# Patient Record
Sex: Female | Born: 1955 | Race: White | Hispanic: No | Marital: Single | Smoking: Never smoker
Health system: Southern US, Community
[De-identification: ages and names within clinical notes are randomized; demographics above are authoritative.]

## PROBLEM LIST (undated history)

## (undated) DIAGNOSIS — G43909 Migraine, unspecified, not intractable, without status migrainosus: Secondary | ICD-10-CM

## (undated) HISTORY — PX: SHOULDER ARTHROSCOPY: SHX128

## (undated) HISTORY — PX: BELOW KNEE LEG AMPUTATION: SUR23

## (undated) HISTORY — DX: Migraine, unspecified, not intractable, without status migrainosus: G43.909

---

## 1999-10-01 ENCOUNTER — Other Ambulatory Visit: Admission: RE | Admit: 1999-10-01 | Discharge: 1999-10-01 | Payer: Self-pay | Admitting: Obstetrics and Gynecology

## 2001-01-09 ENCOUNTER — Other Ambulatory Visit: Admission: RE | Admit: 2001-01-09 | Discharge: 2001-01-09 | Payer: Self-pay | Admitting: Obstetrics and Gynecology

## 2002-01-10 ENCOUNTER — Other Ambulatory Visit: Admission: RE | Admit: 2002-01-10 | Discharge: 2002-01-10 | Payer: Self-pay | Admitting: Obstetrics and Gynecology

## 2010-08-12 HISTORY — PX: OTHER SURGICAL HISTORY: SHX169

## 2012-02-23 ENCOUNTER — Ambulatory Visit: Payer: Self-pay | Admitting: Family Medicine

## 2012-03-01 ENCOUNTER — Encounter: Payer: Self-pay | Admitting: Family Medicine

## 2012-03-01 ENCOUNTER — Ambulatory Visit (INDEPENDENT_AMBULATORY_CARE_PROVIDER_SITE_OTHER): Payer: BC Managed Care – PPO | Admitting: Family Medicine

## 2012-03-01 VITALS — BP 120/76 | HR 65 | Temp 98.0°F | Ht 68.0 in | Wt 167.5 lb

## 2012-03-01 DIAGNOSIS — Z89512 Acquired absence of left leg below knee: Secondary | ICD-10-CM

## 2012-03-01 DIAGNOSIS — S88119A Complete traumatic amputation at level between knee and ankle, unspecified lower leg, initial encounter: Secondary | ICD-10-CM

## 2012-03-01 DIAGNOSIS — M1710 Unilateral primary osteoarthritis, unspecified knee: Secondary | ICD-10-CM

## 2012-03-01 DIAGNOSIS — M1711 Unilateral primary osteoarthritis, right knee: Secondary | ICD-10-CM | POA: Insufficient documentation

## 2012-03-01 DIAGNOSIS — G47419 Narcolepsy without cataplexy: Secondary | ICD-10-CM

## 2012-03-01 DIAGNOSIS — IMO0002 Reserved for concepts with insufficient information to code with codable children: Secondary | ICD-10-CM

## 2012-03-01 DIAGNOSIS — K130 Diseases of lips: Secondary | ICD-10-CM | POA: Insufficient documentation

## 2012-03-01 DIAGNOSIS — J309 Allergic rhinitis, unspecified: Secondary | ICD-10-CM

## 2012-03-01 DIAGNOSIS — Z13220 Encounter for screening for lipoid disorders: Secondary | ICD-10-CM

## 2012-03-01 NOTE — Patient Instructions (Addendum)
Schedule complete physical exam with fasting labs prior. Apply topical OTC hydrocortisone cream 2 % to lip lesion twice daily for 2 weeks. Call for derm referral if area not resolving... To set up biopsy.

## 2012-03-01 NOTE — Progress Notes (Signed)
  Subjective:    Patient ID: Valerie Mcguire, female    DOB: 06/20/1956, 56 y.o.   MRN: [id number]  HPI  56 year old female present to establish. Goes to Dr. Henderson Cloud for GYN. Last CPX few years ago.  Has been a while since last lab testing.  Area in lower lip for 2-3 month, dry and flaky. Nontender.  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  Has some pain in left knee stump from these after walking over rubble. Celebrex from friend helped significantly.  Ortho ([facility]) in past gave her a muscle relaxer and it did not help much.  Ibuprofen has not helped in past.  Review of Systems      Objective:   Physical Exam  Constitutional: Vital signs are normal. She appears well-developed and well-nourished. She is cooperative.  Non-toxic appearance. She does not appear ill. No distress.  HENT:  Head: Normocephalic.  Right Ear: Hearing, tympanic membrane, external ear and ear canal normal.  Left Ear: Hearing, tympanic membrane, external ear and ear canal normal.  Nose: Nose normal.  Eyes: Conjunctivae, EOM and lids are normal. Pupils are equal, round, and reactive to light. No foreign bodies found.  Neck: Trachea normal and normal range of motion. Neck supple. Carotid bruit is not present. No mass and no thyromegaly present.  Cardiovascular: Normal rate, regular rhythm, S1 normal, S2 normal, normal heart sounds and intact distal pulses.  Exam reveals no gallop.   No murmur heard. Pulmonary/Chest: Effort normal and breath sounds normal. No respiratory distress. She has no wheezes. She has no rhonchi. She has no rales.  Abdominal: Soft. Normal appearance and bowel sounds are normal. She exhibits no distension, no fluid wave, no abdominal bruit and no mass. There is no hepatosplenomegaly. There is no tenderness. There is no rebound, no guarding and no CVA tenderness. No hernia.  Musculoskeletal:       Left  BKA.  Lymphadenopathy:    She has no cervical adenopathy.    She has no  axillary adenopathy.  Neurological: She is alert. She has normal strength. No cranial nerve deficit or sensory deficit.  Skin: Skin is warm, dry and intact. Rash noted.       3 mm round flaky skin lesion at lower right lip vermillion border  Psychiatric: Her speech is normal and behavior is normal. Judgment normal. Her mood appears not anxious. Cognition and memory are normal. She does not exhibit a depressed mood.          Assessment & Plan:

## 2012-03-01 NOTE — Assessment & Plan Note (Signed)
Persistent lesion.. May be dermatitis, no suggestion of blister/herpes. If not improving refer to derm for biopsy.

## 2012-03-02 ENCOUNTER — Other Ambulatory Visit: Payer: Self-pay | Admitting: *Deleted

## 2012-03-02 MED ORDER — CELECOXIB 200 MG PO CAPS: 200.0000 mg | ORAL_CAPSULE | Freq: Every day | ORAL | Status: DC

## 2012-03-06 ENCOUNTER — Telehealth: Payer: Self-pay

## 2012-03-06 NOTE — Telephone Encounter (Signed)
Pt said med for lips not called to pharmacy. On 03/01/12 Dr Ermalene Searing recommended OTC Hydrocortisone cream 2% to apply topically to lip; if not better call back for dermatology referral. Pt voiced understanding.

## 2012-04-12 ENCOUNTER — Ambulatory Visit (INDEPENDENT_AMBULATORY_CARE_PROVIDER_SITE_OTHER): Payer: BC Managed Care – PPO | Admitting: Family Medicine

## 2012-04-12 ENCOUNTER — Encounter: Payer: Self-pay | Admitting: Family Medicine

## 2012-04-12 ENCOUNTER — Other Ambulatory Visit (HOSPITAL_COMMUNITY)
Admission: RE | Admit: 2012-04-12 | Discharge: 2012-04-12 | Disposition: A | Discharge status: Home / Self Care | Payer: BC Managed Care – PPO | Source: Ambulatory Visit | Attending: Family Medicine | Admitting: Family Medicine

## 2012-04-12 VITALS — BP 100/70 | HR 68 | Temp 98.2°F | Ht 68.0 in | Wt 169.5 lb

## 2012-04-12 DIAGNOSIS — D3613 Benign neoplasm of peripheral nerves and autonomic nervous system of lower limb, including hip: Secondary | ICD-10-CM | POA: Insufficient documentation

## 2012-04-12 DIAGNOSIS — Z0000 Encounter for general adult medical examination without abnormal findings: Secondary | ICD-10-CM

## 2012-04-12 DIAGNOSIS — Z01419 Encounter for gynecological examination (general) (routine) without abnormal findings: Secondary | ICD-10-CM | POA: Insufficient documentation

## 2012-04-12 DIAGNOSIS — Z1212 Encounter for screening for malignant neoplasm of rectum: Secondary | ICD-10-CM

## 2012-04-12 DIAGNOSIS — Z13220 Encounter for screening for lipoid disorders: Secondary | ICD-10-CM

## 2012-04-12 DIAGNOSIS — Z1231 Encounter for screening mammogram for malignant neoplasm of breast: Secondary | ICD-10-CM

## 2012-04-12 DIAGNOSIS — D2120 Benign neoplasm of connective and other soft tissue of unspecified lower limb, including hip: Secondary | ICD-10-CM

## 2012-04-12 DIAGNOSIS — K130 Diseases of lips: Secondary | ICD-10-CM

## 2012-04-12 MED ORDER — TRIAMCINOLONE ACETONIDE 0.5 % EX CREA: TOPICAL_CREAM | Freq: Two times a day (BID) | CUTANEOUS | Status: DC

## 2012-04-12 NOTE — Patient Instructions (Addendum)
Stop at front desk to schedule mammogram. If neuroma reoccurs we will try meloxicam for pain. Try topical triamcinolone on lip lesion. Call for derm referral if not improivng in 2 weeks. If vaginal bleeding recurs.. Call for further recommendations.

## 2012-04-12 NOTE — Progress Notes (Signed)
Subjective:    Patient ID: Valerie Mcguire, female    DOB: [birth year], 56 y.o.   MRN: [id number]  HPI The patient is here for annual wellness exam and preventative care.    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  Has some pain in left knee stump from these after walking over rubble. Celebrex from friend helped significantly.  Ortho ([facility]) in past gave her a muscle relaxer and it did not help much.  Will do labs today while at appt.  Doing well overall.  She did not some vaginal spotting last week and vaginal itching .. vagisil helped. No current vaginal itching or discharge. Last regular menses over 1 year ago.  Lip lesion: OTC hydrocortisone .Marland Kitchen Did not help. She wants to try something else before going to [facility].  Review of Systems  Constitutional: Negative for fever, fatigue and unexpected weight change.  HENT: Negative for ear pain, congestion, sore throat, sneezing, trouble swallowing and sinus pressure.   Eyes: Negative for pain and itching.  Respiratory: Negative for cough, shortness of breath and wheezing.   Cardiovascular: Negative for chest pain, palpitations and leg swelling.  Gastrointestinal: Negative for nausea, abdominal pain, diarrhea, constipation and blood in stool.  Genitourinary: Negative for dysuria, hematuria, vaginal discharge, difficulty urinating and menstrual problem.  Skin: Negative for rash.  Neurological: Negative for syncope, weakness, light-headedness, numbness and headaches.  Psychiatric/Behavioral: Negative for confusion and dysphoric mood. The patient is not nervous/anxious.        Objective:   Physical Exam  Constitutional: Vital signs are normal. She appears well-developed and well-nourished. She is cooperative.  Non-toxic appearance. She does not appear ill. No distress.  HENT:  Head: Normocephalic.  Right Ear: Hearing, tympanic membrane, external ear and ear canal normal.  Left Ear: Hearing, tympanic membrane, external ear  and ear canal normal.  Nose: Nose normal.  Eyes: Conjunctivae, EOM and lids are normal. Pupils are equal, round, and reactive to light. No foreign bodies found.  Neck: Trachea normal and normal range of motion. Neck supple. Carotid bruit is not present. No mass and no thyromegaly present.  Cardiovascular: Normal rate, regular rhythm, S1 normal, S2 normal, normal heart sounds and intact distal pulses.  Exam reveals no gallop.   No murmur heard. Pulmonary/Chest: Effort normal and breath sounds normal. No respiratory distress. She has no wheezes. She has no rhonchi. She has no rales.  Abdominal: Soft. Normal appearance and bowel sounds are normal. She exhibits no distension, no fluid wave, no abdominal bruit and no mass. There is no hepatosplenomegaly. There is no tenderness. There is no rebound, no guarding and no CVA tenderness. No hernia.  Genitourinary: Vagina normal and uterus normal. No breast swelling, tenderness, discharge or bleeding. Pelvic exam was performed with patient prone. There is no rash, tenderness or lesion on the right labia. There is no rash, tenderness or lesion on the left labia. Uterus is not enlarged and not tender. Cervix exhibits no motion tenderness, no discharge and no friability. Right adnexum displays no mass, no tenderness and no fullness. Left adnexum displays no mass, no tenderness and no fullness.  Musculoskeletal:       Left  BKA.  Lymphadenopathy:    She has no cervical adenopathy.    She has no axillary adenopathy.  Neurological: She is alert. She has normal strength. No cranial nerve deficit or sensory deficit.  Skin: Skin is warm, dry and intact. Rash noted.       3 mm round  flaky skin lesion at lower right lip vermillion border  Psychiatric: Her speech is normal and behavior is normal. Judgment normal. Her mood appears not anxious. Cognition and memory are normal. She does not exhibit a depressed mood.          Assessment & Plan:  The patient's  preventative maintenance and recommended screening tests for an annual wellness exam were reviewed in full today. Brought up to date unless services declined.  Counselled on the importance of diet, exercise, and its role in overall health and mortality. The patient's FH and SH was reviewed, including their home life, tobacco status, and drug and alcohol status.   Vaccines: Uptodate with Td PAP/DVE: in past has seen Dr. Henderson [facility], plans to do here now. If nml plan q 2 year for pap and yearly for DVE.  Colon: ifob.  Nonsmoker Mammo:  Will schedule.

## 2012-04-12 NOTE — Addendum Note (Signed)
Addended by: Josph Macho A on: 04/12/2012 04:14 PM   Modules accepted: Orders

## 2012-04-12 NOTE — Assessment & Plan Note (Signed)
Possible basal or squamous cell. Will try treating inflammatory issues with topical steroid, stronger than PTC... If not improving will refer to [facility] for biopsy.

## 2012-04-12 NOTE — Addendum Note (Signed)
Addended by: Alvina Chou on: 04/12/2012 03:55 PM   Modules accepted: Orders

## 2012-04-13 LAB — COMPREHENSIVE METABOLIC PANEL
Alkaline Phosphatase: 64 U/L (ref 39–117)
BUN: 18 mg/dL (ref 6–23)
CO2: 27 mEq/L (ref 19–32)
Creatinine, Ser: 1 mg/dL (ref 0.4–1.2)
GFR: 64.63 mL/min (ref >60.00)
Glucose, Bld: 84 mg/dL (ref 70–99)
Sodium: 139 mEq/L (ref 135–145)
Total Bilirubin: 0.9 mg/dL (ref 0.3–1.2)
Total Protein: 6.3 g/dL (ref 6.0–8.3)

## 2012-04-13 LAB — LIPID PANEL
Cholesterol: 165 mg/dL (ref 0–200)
HDL: 50.8 mg/dL (ref >39.00)
LDL Cholesterol: 105 mg/dL — ABNORMAL HIGH (ref 0–99)
Triglycerides: 45 mg/dL (ref 0.0–149.0)
VLDL: 9 mg/dL (ref 0.0–40.0)

## 2012-04-17 ENCOUNTER — Other Ambulatory Visit: Payer: BC Managed Care – PPO

## 2012-04-17 DIAGNOSIS — Z1212 Encounter for screening for malignant neoplasm of rectum: Secondary | ICD-10-CM

## 2012-04-18 ENCOUNTER — Encounter: Payer: Self-pay | Admitting: *Deleted

## 2012-04-23 ENCOUNTER — Encounter: Payer: Self-pay | Admitting: *Deleted

## 2012-05-02 ENCOUNTER — Ambulatory Visit: Payer: Self-pay | Admitting: Family Medicine

## 2012-05-03 ENCOUNTER — Encounter: Payer: Self-pay | Admitting: Family Medicine

## 2012-05-08 ENCOUNTER — Encounter: Payer: Self-pay | Admitting: Family Medicine

## 2012-05-08 ENCOUNTER — Encounter: Payer: Self-pay | Admitting: *Deleted

## 2012-07-12 ENCOUNTER — Ambulatory Visit (INDEPENDENT_AMBULATORY_CARE_PROVIDER_SITE_OTHER): Payer: BC Managed Care – PPO | Admitting: Family Medicine

## 2012-07-12 ENCOUNTER — Encounter: Payer: Self-pay | Admitting: Family Medicine

## 2012-07-12 ENCOUNTER — Telehealth: Payer: Self-pay | Admitting: Family Medicine

## 2012-07-12 VITALS — BP 118/78 | HR 64 | Temp 98.3°F | Wt 168.5 lb

## 2012-07-12 DIAGNOSIS — R51 Headache: Secondary | ICD-10-CM

## 2012-07-12 DIAGNOSIS — R519 Headache, unspecified: Secondary | ICD-10-CM | POA: Insufficient documentation

## 2012-07-12 MED ORDER — CYCLOBENZAPRINE HCL 10 MG PO TABS: 10.0000 mg | ORAL_TABLET | Freq: Two times a day (BID) | ORAL | Status: AC | PRN

## 2012-07-12 MED ORDER — KETOROLAC TROMETHAMINE 30 MG/ML IJ SOLN
30.0000 mg | Freq: Once | INTRAMUSCULAR | Status: AC
  Administered 2012-07-12: 30 mg via INTRAMUSCULAR

## 2012-07-12 NOTE — Telephone Encounter (Signed)
Caller: Jaclynne/Patient; Patient Name: Valerie Mcguire; PCP: Kerby Nora [facility]); Best Callback Phone Number: [phone]  Today, 07/12/2012 , pt calling  with complaints of Headache since 1200 noon.  Pain is ove right eye and is making the eye to water.  She has taken anthistamine , and OTC Migraine tablet  every 3-4 hours since then , but still with pain. Has had some relief since last night , but rating 6-7/10.  Has had light sensivity and has been laying in dark bedroom. RN reached See ED Immediately disposition for increased light sensitivity and not previously evaluated per Headache protocol. NO appts with Dr. Ermalene Searing ,nor Dr. Patsy Lager. RN noted a 4:00 appt  with Dr Ruta Hinds. RN called office and "Rose" advised may place pt on schedule at 4:00 with him and pt advised.

## 2012-07-12 NOTE — Telephone Encounter (Signed)
Saw today

## 2012-07-12 NOTE — Progress Notes (Signed)
  Subjective:    Patient ID: Valerie Mcguire, female    DOB: 03/16/1956, 56 y.o.   MRN: [id number]  HPI CC: HA  56 yo with 24 hour h/o R headache behind R eye, described as pounding.  Constant throbbing ache.  Minimal nausea.  + photo/phonophobia.  + watery eye.  + droopy eye.  No sharp stabbing pain that lasts seconds.  Has tried nonsedating antihistamine and OTC migraine medicine, haven't helped.  Denies vision changes.    Has had headaches like this in past, last one was in spring.  Tends to get a few a year.  Usually improves with sleep.  This one isn't improving.  No fevers/chills, neck pain, sinus congestion, ear pain, or tooth pain.  No shortness of breath.  No dizziness.  No past medical history on file.   Review of Systems Per HPI    Objective:   Physical Exam  Nursing note and vitals reviewed. Constitutional: She appears well-developed and well-nourished. No distress.  HENT:  Head: Normocephalic and atraumatic.  Right Ear: Hearing, tympanic membrane, external ear and ear canal normal.  Left Ear: Hearing, tympanic membrane, external ear and ear canal normal.  Nose: No mucosal edema or rhinorrhea. Right sinus exhibits no maxillary sinus tenderness and no frontal sinus tenderness. Left sinus exhibits no maxillary sinus tenderness and no frontal sinus tenderness.  Mouth/Throat: Uvula is midline, oropharynx is clear and moist and mucous membranes are normal. No oropharyngeal exudate, posterior oropharyngeal edema, posterior oropharyngeal erythema or tonsillar abscesses.  Eyes: EOM are normal. Pupils are equal, round, and reactive to light. Right conjunctiva is injected (mild conjunctival). Right conjunctiva has no hemorrhage. Left conjunctiva is not injected. Left conjunctiva has no hemorrhage. No scleral icterus. Right eye exhibits normal extraocular motion and no nystagmus. Left eye exhibits normal extraocular motion and no nystagmus.       Ptosis and lacrimation  present. PERRLA  Neck: Normal range of motion. Neck supple.  Cardiovascular: Normal rate, regular rhythm, normal heart sounds and intact distal pulses.   No murmur heard. Pulmonary/Chest: Effort normal and breath sounds normal. No respiratory distress. She has no wheezes. She has no rales.  Lymphadenopathy:    She has no cervical adenopathy.  Neurological: No cranial nerve deficit or sensory deficit.       No nystagmus.       Assessment & Plan:

## 2012-07-12 NOTE — Patient Instructions (Addendum)
Sounds like you are having a migraine, less consistent with cluster headaches. Treat with shot of toradol today. May fill flexeril muscle relaxant to help stop headache at home.  Get plenty of rest and stay well hydrated. Try to back off caffeine if you drink a significant amount. Call us with questions. I hope you start feeling better. If not improving or any worsening, please seek care again for further evaluation.  Migraine Headache A migraine headache is an intense, throbbing pain on one or both sides of your head. A migraine can last for 30 minutes to several hours. CAUSES  The exact cause of a migraine headache is not always known. However, a migraine may be caused when nerves in the brain become irritated and release chemicals that cause inflammation. This causes pain. SYMPTOMS  Pain on one or both sides of your head.  Pulsating or throbbing pain.  Severe pain that prevents daily activities.  Pain that is aggravated by any physical activity.  Nausea, vomiting, or both.  Dizziness.  Pain with exposure to bright lights, loud noises, or activity.  General sensitivity to bright lights, loud noises, or smells. Before you get a migraine, you may get warning signs that a migraine is coming (aura). An aura may include:  Seeing flashing lights.  Seeing bright spots, halos, or zig-zag lines.  Having tunnel vision or blurred vision.  Having feelings of numbness or tingling.  Having trouble talking.  Having muscle weakness. MIGRAINE TRIGGERS  Alcohol.  Smoking.  Stress.  Menstruation.  Aged cheeses.  Foods or drinks that contain nitrates, glutamate, aspartame, or tyramine.  Lack of sleep.  Chocolate.  Caffeine.  Hunger.  Physical exertion.  Fatigue.  Medicines used to treat chest pain (nitroglycerine), birth control pills, estrogen, and some blood pressure medicines. DIAGNOSIS  A migraine headache is often diagnosed based on:  Symptoms.  Physical  examination.  A CT scan or MRI of your head. TREATMENT Medicines may be given for pain and nausea. Medicines can also be given to help prevent recurrent migraines.  HOME CARE INSTRUCTIONS  Only take over-the-counter or prescription medicines for pain or discomfort as directed by your caregiver. The use of long-term narcotics is not recommended.  Lie down in a dark, quiet room when you have a migraine.  Keep a journal to find out what may trigger your migraine headaches. For example, write down:  What you eat and drink.  How much sleep you get.  Any change to your diet or medicines.  Limit alcohol consumption.  Quit smoking if you smoke.  Get 7 to 9 hours of sleep, or as recommended by your caregiver.  Limit stress.  Keep lights dim if bright lights bother you and make your migraines worse. SEEK IMMEDIATE MEDICAL CARE IF:   Your migraine becomes severe.  You have a fever.  You have a stiff neck.  You have vision loss.  You have muscular weakness or loss of muscle control.  You start losing your balance or have trouble walking.  You feel faint or pass out.  You have severe symptoms that are different from your first symptoms. MAKE SURE YOU:   Understand these instructions.  Will watch your condition.  Will get help right away if you are not doing well or get worse. Document Released: 08/29/2005 Document Revised: 11/21/2011 Document Reviewed: 08/19/2011 [facility] Patient Information 2013 [place], [place].

## 2012-07-12 NOTE — Assessment & Plan Note (Addendum)
R periorbital headache associated with some autonomic sxs of R eye, however pain description not at all consistent with cluster headaches. Pain description more consistent with migraines.   Will treat as migraine with shot of toradol today as well as flexeril for abortive treatment at home.  Sedation instructions discussed. Also discussed possible dx of cluster HA vs other dx, if not improving with above treatment to return for further evaluation. nonfocal neuro exam today.

## 2012-10-15 ENCOUNTER — Ambulatory Visit (INDEPENDENT_AMBULATORY_CARE_PROVIDER_SITE_OTHER): Payer: BC Managed Care – PPO | Admitting: Family Medicine

## 2012-10-15 ENCOUNTER — Encounter: Payer: Self-pay | Admitting: Family Medicine

## 2012-10-15 VITALS — BP 124/84 | HR 66 | Temp 98.0°F | Wt 167.8 lb

## 2012-10-15 DIAGNOSIS — R319 Hematuria, unspecified: Secondary | ICD-10-CM

## 2012-10-15 DIAGNOSIS — R300 Dysuria: Secondary | ICD-10-CM

## 2012-10-15 LAB — POCT URINALYSIS DIPSTICK
Ketones, UA: NEGATIVE
Protein, UA: NEGATIVE
Spec Grav, UA: 1.015

## 2012-10-15 MED ORDER — SULFAMETHOXAZOLE-TRIMETHOPRIM 800-160 MG PO TABS: 1.0000 | ORAL_TABLET | Freq: Two times a day (BID) | ORAL | Status: DC

## 2012-10-15 NOTE — Patient Instructions (Addendum)
Drink plenty of water and hold onto the antibiotics.  Start the antibiotics if you have more symptoms. We'll contact you with your lab report.  Take care.

## 2012-10-15 NOTE — Progress Notes (Signed)
Dysuria: yes duration of symptoms: about 3 days abdominal pain:no fevers:no back pain:no vomiting:no other concerns: she saw blood in urine yesterday.  Used OTC meds and feels better today.   Recently back from [place] and just finished malarone course.  Had tolerated this before.    U/a unremarkable today.    Meds, vitals, and allergies reviewed.   ROS: See HPI.  Otherwise negative.    GEN: nad, alert and oriented HEENT: mucous membranes moist NECK: supple CV: rrr.  PULM: ctab, no inc wob ABD: soft, +bs, suprapubic area not tender SKIN: no acute rash BACK: no CVA pain

## 2012-10-16 DIAGNOSIS — R300 Dysuria: Secondary | ICD-10-CM | POA: Insufficient documentation

## 2012-10-16 NOTE — Assessment & Plan Note (Signed)
Drink plenty of water and hold onto septra rx as she is improved today.  Check ucx in meantime.  She agrees. F/u prn.

## 2012-10-17 ENCOUNTER — Telehealth: Payer: Self-pay

## 2012-10-17 NOTE — Telephone Encounter (Signed)
Pt request urine culture results when available.

## 2012-10-23 ENCOUNTER — Telehealth: Payer: Self-pay

## 2012-10-23 NOTE — Telephone Encounter (Signed)
Yes, I would fill it.  Thanks.

## 2012-10-23 NOTE — Telephone Encounter (Signed)
Patient advised.

## 2012-10-23 NOTE — Telephone Encounter (Signed)
Pt seen 10/15/12 still has prescription for antibiotic; on and off pt has frequency of urine and today has burning upon urination. No fever, back or abdominal pain or blood in urine. Pt wants to know if she should get antibiotic prescription filled.[facility] [address]Please advise.

## 2012-11-26 ENCOUNTER — Encounter: Payer: Self-pay | Admitting: Family Medicine

## 2012-11-26 ENCOUNTER — Ambulatory Visit (INDEPENDENT_AMBULATORY_CARE_PROVIDER_SITE_OTHER): Payer: BC Managed Care – PPO | Admitting: Family Medicine

## 2012-11-26 ENCOUNTER — Encounter: Payer: Self-pay | Admitting: *Deleted

## 2012-11-26 VITALS — BP 120/72 | HR 67 | Temp 98.2°F | Ht 68.0 in | Wt 167.5 lb

## 2012-11-26 DIAGNOSIS — M25519 Pain in unspecified shoulder: Secondary | ICD-10-CM

## 2012-11-26 DIAGNOSIS — M25511 Pain in right shoulder: Secondary | ICD-10-CM

## 2012-11-26 NOTE — Progress Notes (Signed)
[facility] [address] [place] [address] Phone: [phone] Fax: [phone]  Date:  11/26/2012   Name:  Valerie Mcguire   DOB:  04-06-1956   MRN:  [id number] Gender: female Age: 57 y.o.  Primary Physician:  Kerby Nora, MD  Evaluating MD: Hannah Beat, MD   Chief Complaint: Shoulder Pain   History of Present Illness:  Valerie Mcguire is a 57 y.o. pleasant patient who presents with the following:  R shoulder pain: fall / trauma:  Personnel officer at [employer]. , strong 57 year old patient who presents after falling and traumatizing her RIGHT shoulder  2 days prior to the date of this encounter. She continues to have preserved grip strength, however she does have significant pain about the shoulder itself, somewhat in a T-shirt distribution, and she also has limited range of motion and limited abduction currently. No prior surgery on either shoulder.  She is somewhat better compared to yesterday today.. She does have significant limitations in abduction, flexion, internal range of motion.  Patient Active Problem List  Diagnosis  . Allergic rhinitis  . Osteoarthritis of right knee  . History of left below knee amputation  . Narcolepsy  . Lip lesion  . Neuroma of lower extremity  . Headache  . Dysuria    No past medical history on file.  Past Surgical History  Procedure Laterality Date  . Arthroscopic knee surgery, right  08/2010    History   Social History  . Marital Status: Unknown    Spouse Name: N/A    Number of Children: N/A  . Years of Education: N/A   Occupational History  . Not on file.   Social History Main Topics  . Smoking status: Never Smoker   . Smokeless tobacco: Never Used  . Alcohol Use: No  . Drug Use: No  . Sexually Active: No   Other Topics Concern  . Not on file   Social History Narrative   Divorced, single   Exercise: Gym daily.   Diet:Fruits and veggies.   Work as Personnel officer.    Family  History  Problem Relation Age of Onset  . Arthritis Mother   . Hypertension Mother   . Hypertension Father   . Heart disease Father     No Known Allergies  Medication list has been reviewed and updated.  Outpatient Prescriptions Prior to Visit  Medication Sig Dispense Refill  . b complex vitamins capsule Take 1 capsule by mouth daily.      Marland Kitchen glucosamine-chondroitin 500-400 MG tablet Take 1 tablet by mouth daily.      Marland Kitchen Lysine 1000 MG TABS Take by mouth daily.      . Multiple Vitamin (MULTIVITAMIN) tablet Take 1 tablet by mouth daily.      . meloxicam (MOBIC) 15 MG tablet Take 15 mg by mouth daily.      Marland Kitchen sulfamethoxazole-trimethoprim (BACTRIM DS,SEPTRA DS) 800-160 MG per tablet Take 1 tablet by mouth 2 (two) times daily.  6 tablet  0   No facility-administered medications prior to visit.    Review of Systems:   GEN: No fevers, chills. Nontoxic. Primarily MSK c/o today. MSK: Detailed in the HPI GI: tolerating PO intake without difficulty Neuro: No numbness, parasthesias, or tingling associated. Otherwise the pertinent positives of the ROS are noted above.    Physical Examination: BP 120/72  Pulse 67  Temp(Src) 98.2 F (36.8 C) (Oral)  Ht 5\' 8"  (1.727 m)  Wt 167 lb 8 oz (75.978 kg)  BMI 25.47 kg/m2  SpO2 97%  Ideal Body Weight: Weight in (lb) to have BMI = 25: 164.1   GEN: WDWN, NAD, Non-toxic, Alert & Oriented x 3 HEENT: Atraumatic, Normocephalic.  Ears and Nose: No external deformity. EXTR: No clubbing/cyanosis/edema NEURO: Normal gait.  PSYCH: Normally interactive. Conversant. Not depressed or anxious appearing.  Calm demeanor.   RIGHT shoulder: Nontender along the clavicle. Nontender at the a.c. Joint. Nontender at the a.c. Nontender along the humerus. Nontender with elbow manipulation. Nontender along the radius and ulna.  Abduction is limited secondary to pain. Patient is able to actively abduct to 85. Passive abduction is greater to 130. Flexion to 90  actively. Passively to 130. Internal/external range of motion is 5/5.  Abduction is 4+/5. Drop arm is negative. Resistible abduction causes pain.  Assessment and Plan:  Pain in joint, shoulder region, right   Most likely combination rotator cuff contusion with a.c. Joint grade 1 separation. Cannot exclude small grade partial thickness rotator cuff tear. Full-thickness rotator cuff tear is possible but unlikely.  Limited work activities. No climbing ladders due to safety risk. Maximum lifting 15 pounds. Ice and anti-inflammatories.  Recheck in 2 weeks.  Orders Today:  No orders of the defined types were placed in this encounter.    Updated Medication List: (Includes new medications, updates to list, dose adjustments) No orders of the defined types were placed in this encounter.    Medications Discontinued: Medications Discontinued During This Encounter  Medication Reason  . sulfamethoxazole-trimethoprim (BACTRIM DS,SEPTRA DS) 800-160 MG per tablet Error  . meloxicam (MOBIC) 15 MG tablet Error     Signed, Lainey Nelson T. Hulon Ferron, MD 11/26/2012 10:10 AM

## 2012-11-26 NOTE — Patient Instructions (Addendum)
Fu 2 weeks

## 2012-12-03 ENCOUNTER — Ambulatory Visit (INDEPENDENT_AMBULATORY_CARE_PROVIDER_SITE_OTHER): Payer: BC Managed Care – PPO | Admitting: Family Medicine

## 2012-12-03 ENCOUNTER — Encounter: Payer: Self-pay | Admitting: Family Medicine

## 2012-12-03 VITALS — BP 110/78 | HR 60 | Temp 98.1°F | Wt 170.5 lb

## 2012-12-03 DIAGNOSIS — M25511 Pain in right shoulder: Secondary | ICD-10-CM

## 2012-12-03 DIAGNOSIS — M25519 Pain in unspecified shoulder: Secondary | ICD-10-CM

## 2012-12-03 NOTE — Progress Notes (Signed)
[facility] at [facility] [address] [place] [address] Phone: [phone] Fax: [phone]  Date:  12/03/2012   Name:  Valerie Mcguire   DOB:  [birth year]   MRN:  [id number] Gender: female Age: 57 y.o.  Primary Physician:  Kerby Nora, MD  Evaluating MD: Hannah Beat, MD   Chief Complaint: Follow-up   History of Present Illness:  Valerie Mcguire is a 57 y.o. pleasant patient who presents with the following:  F/u R shoulder injury 1 week ago, had some significant ROM loss and str deficit last week. Has been diligent with ROM and rehab. No numbness or tingling  Patient Active Problem List  Diagnosis  . Allergic rhinitis  . Osteoarthritis of right knee  . History of left below knee amputation  . Narcolepsy  . Lip lesion  . Neuroma of lower extremity  . Headache  . Dysuria    No past medical history on file.  Past Surgical History  Procedure Laterality Date  . Arthroscopic knee surgery, right  08/2010    History   Social History  . Marital Status: Unknown    Spouse Name: N/A    Number of Children: N/A  . Years of Education: N/A   Occupational History  . Not on file.   Social History Main Topics  . Smoking status: Never Smoker   . Smokeless tobacco: Never Used  . Alcohol Use: No  . Drug Use: No  . Sexually Active: No   Other Topics Concern  . Not on file   Social History Narrative   Divorced, single   Exercise: Gym daily.   Diet:Fruits and veggies.   Work as Personnel officer.    Family History  Problem Relation Age of Onset  . Arthritis Mother   . Hypertension Mother   . Hypertension Father   . Heart disease Father     No Known Allergies  Medication list has been reviewed and updated.  Outpatient Prescriptions Prior to Visit  Medication Sig Dispense Refill  . b complex vitamins capsule Take 1 capsule by mouth daily.      Marland Kitchen glucosamine-chondroitin 500-400 MG tablet Take 1 tablet by mouth daily.      Marland Kitchen Lysine 1000  MG TABS Take by mouth daily.      . Multiple Vitamin (MULTIVITAMIN) tablet Take 1 tablet by mouth daily.       No facility-administered medications prior to visit.    Review of Systems:   GEN: No fevers, chills. Nontoxic. Primarily MSK c/o today. MSK: Detailed in the HPI GI: tolerating PO intake without difficulty Neuro: No numbness, parasthesias, or tingling associated. Otherwise the pertinent positives of the ROS are noted above.    Physical Examination: BP 110/78  Pulse 60  Temp(Src) 98.1 F (36.7 C) (Oral)  Wt 170 lb 8 oz (77.338 kg)  BMI 25.93 kg/m2  SpO2 97%  Ideal Body Weight:     GEN: WDWN, NAD, Non-toxic, Alert & Oriented x 3 HEENT: Atraumatic, Normocephalic.  Ears and Nose: No external deformity. EXTR: No clubbing/cyanosis/edema NEURO: Normal gait.  PSYCH: Normally interactive. Conversant. Not depressed or anxious appearing.  Calm demeanor.   Shoulder: R Inspection: No muscle wasting or winging Ecchymosis/edema: neg  AC joint, scapula, clavicle: NT Cervical spine: NT, full ROM Spurling's: neg Abduction: full, 5/5 Flexion: full, 5/5 - TTP with flexion IR, full, lift-off: 5/5 ER at neutral: full, 5/5 AC crossover and compression: TENDER Neer: tender Hawkins: neg Drop Test: neg Empty Can: neg Supraspinatus insertion:  NT Bicipital groove: NT Speed's: neg Yergason's: neg Sulcus sign: neg Scapular dyskinesis: none C5-T1 intact Sensation intact Grip 5/5   Assessment and Plan:  Right shoulder pain  Resolving cuff contusion, appears to be doing much better, but not able to do overhead work without pain. 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  rtw 1 week as planned.  Orders Today:  No orders of the defined types were placed in this encounter.    Updated Medication List: (Includes new medications, updates to list, dose adjustments) No orders of the defined  types were placed in this encounter.    Medications Discontinued: There are no discontinued medications.    Signed, Elpidio Galea. Nyeema Want, MD 12/03/2012 8:13 AM

## 2012-12-07 ENCOUNTER — Telehealth: Payer: Self-pay

## 2012-12-07 NOTE — Telephone Encounter (Signed)
Called and discussed with her - back a little on rehab at gym and limit based on pain.

## 2012-12-07 NOTE — Telephone Encounter (Signed)
Pt left v/m; pt seen 12/03/12; presently pt is doing exercises at home and working with trainer; at night pt has throbbing pain in rt shoulder and cannot rest. Pt is taking Aleve for pain; pt does not want stronger pain med just wants to know if pt should continue what she is doing and should pt be concerned. Pt is to return to on 12/10/12.Please advise. [facility].

## 2012-12-10 ENCOUNTER — Ambulatory Visit: Payer: BC Managed Care – PPO | Admitting: Family Medicine

## 2012-12-17 ENCOUNTER — Telehealth: Payer: Self-pay

## 2012-12-17 MED ORDER — TRAMADOL HCL 50 MG PO TABS: 50.0000 mg | ORAL_TABLET | Freq: Four times a day (QID) | ORAL | Status: DC | PRN

## 2012-12-17 NOTE — Telephone Encounter (Signed)
Pt request pain med for rt shoulder at nighttime; pain does not bother pt during the day but when pt lays down pain level is 6 and has deep throbbing pain in rt shoulder. [facility].

## 2012-12-17 NOTE — Telephone Encounter (Signed)
Tramadol 50 mg, 1 po q 6 hours prn pain, #40, 0 refills  

## 2012-12-31 ENCOUNTER — Telehealth: Payer: Self-pay

## 2012-12-31 ENCOUNTER — Encounter: Payer: Self-pay | Admitting: *Deleted

## 2012-12-31 NOTE — Telephone Encounter (Signed)
Appt scheduled

## 2012-12-31 NOTE — Telephone Encounter (Signed)
Pt left v/m; continuing with shoulder pain; thinks pain is better at night without Tramadol(pt said Tramadol did not help) but since having pain for 6 weeks could pt get cortisone injection.Please advise.

## 2012-12-31 NOTE — Telephone Encounter (Signed)
Have her f/u with me

## 2013-01-02 ENCOUNTER — Encounter: Payer: Self-pay | Admitting: Family Medicine

## 2013-01-02 ENCOUNTER — Ambulatory Visit (INDEPENDENT_AMBULATORY_CARE_PROVIDER_SITE_OTHER): Payer: BC Managed Care – PPO | Admitting: Family Medicine

## 2013-01-02 VITALS — BP 120/72 | HR 69 | Temp 98.1°F | Ht 68.0 in | Wt 168.5 lb

## 2013-01-02 DIAGNOSIS — M25519 Pain in unspecified shoulder: Secondary | ICD-10-CM

## 2013-01-02 DIAGNOSIS — M25511 Pain in right shoulder: Secondary | ICD-10-CM

## 2013-01-02 NOTE — Progress Notes (Signed)
[facility] at [facility] [address] [place] [address] Phone: [phone] Fax: [phone]  Date:  01/02/2013   Name:  Valerie Mcguire   DOB:  1956/01/04   MRN:  [id number] Gender: female Age: 57 y.o.  Primary Physician:  Kerby Nora, MD  Evaluating MD: Hannah Beat, MD   Chief Complaint: Shoulder Pain   History of Present Illness:  Valerie Mcguire is a 57 y.o. pleasant patient who presents with the following:  F/u R shoulder injury. She is doing better, but still has some significant pain with abduction and overhead activities. No loss of motion. No numbness. T-shirt distribution of pain.  subac injection  Patient Active Problem List  Diagnosis  . Allergic rhinitis  . Osteoarthritis of right knee  . History of left below knee amputation  . Narcolepsy  . Lip lesion  . Neuroma of lower extremity  . Headache  . Dysuria    No past medical history on file.  Past Surgical History  Procedure Laterality Date  . Arthroscopic knee surgery, right  08/2010    History   Social History  . Marital Status: Unknown    Spouse Name: N/A    Number of Children: N/A  . Years of Education: N/A   Occupational History  . Not on file.   Social History Main Topics  . Smoking status: Never Smoker   . Smokeless tobacco: Never Used  . Alcohol Use: No  . Drug Use: No  . Sexually Active: No   Other Topics Concern  . Not on file   Social History Narrative   Divorced, single   Exercise: Gym daily.   Diet:Fruits and veggies.   Work as Personnel officer.    Family History  Problem Relation Age of Onset  . Arthritis Mother   . Hypertension Mother   . Hypertension Father   . Heart disease Father     No Known Allergies  Medication list has been reviewed and updated.  Outpatient Prescriptions Prior to Visit  Medication Sig Dispense Refill  . b complex vitamins capsule Take 1 capsule by mouth daily.      Marland Kitchen glucosamine-chondroitin 500-400 MG tablet  Take 1 tablet by mouth daily.      Marland Kitchen Lysine 1000 MG TABS Take by mouth daily.      . Multiple Vitamin (MULTIVITAMIN) tablet Take 1 tablet by mouth daily.      . traMADol (ULTRAM) 50 MG tablet Take 1 tablet (50 mg total) by mouth every 6 (six) hours as needed for pain.  40 tablet  0   No facility-administered medications prior to visit.    Review of Systems:   GEN: No fevers, chills. Nontoxic. Primarily MSK c/o today. MSK: Detailed in the HPI GI: tolerating PO intake without difficulty Neuro: No numbness, parasthesias, or tingling associated. Otherwise the pertinent positives of the ROS are noted above.    Physical Examination: BP 120/72  Pulse 69  Temp(Src) 98.1 F (36.7 C) (Oral)  Ht 5\' 8"  (1.727 m)  Wt 168 lb 8 oz (76.431 kg)  BMI 25.63 kg/m2  SpO2 95%  Ideal Body Weight: Weight in (lb) to have BMI = 25: 164.1   GEN: Well-developed,well-nourished,in no acute distress; alert,appropriate and cooperative throughout examination HEENT: Normocephalic and atraumatic without obvious abnormalities. Ears, externally no deformities PULM: Breathing comfortably in no respiratory distress EXT: No clubbing, cyanosis, or edema PSYCH: Normally interactive. Cooperative during the interview. Pleasant. Friendly and conversant. Not anxious or depressed appearing. Normal, full affect.  Shoulder: R Inspection: No muscle wasting or winging Ecchymosis/edema: neg  AC joint, scapula, clavicle: NT Cervical spine: NT, full ROM Spurling's: neg Abduction: full, 5/5 Flexion: full, 5/5 IR, full, lift-off: 5/5 ER at neutral: full, 5/5 AC crossover: neg Neer: neg Hawkins: pos Drop Test: neg Empty Can: pos Supraspinatus insertion: nt Bicipital groove: NT Speed's: neg Yergason's: neg Sulcus sign: neg Scapular dyskinesis: none C5-T1 intact  Neuro: Sensation intact Grip 5/5   Assessment and Plan:  Pain in joint, shoulder region, right  A degree of impingement. Suspect small degree of  partial tear initially. Moon shoulder protocol.  SubAC Injection, RIGHT Verbal consent was obtained from the patient. Risks (including rare infection), benefits, and alternatives were explained. Patient prepped with Chloraprep and Ethyl Chloride used for anesthesia. The subacromial space was injected using the posterior approach. The patient tolerated the procedure well and had decreased pain post injection. No complications. Injection: 8 cc of Lidocaine 1% and 2 cc of Depo-Medrol 40 mg. Needle: 22 gauge  Orders Today:  No orders of the defined types were placed in this encounter.    Updated Medication List: (Includes new medications, updates to list, dose adjustments) No orders of the defined types were placed in this encounter.    Medications Discontinued: There are no discontinued medications.    Signed, Elpidio Galea. Briana Newman, MD 01/02/2013 10:27 AM

## 2013-02-13 ENCOUNTER — Ambulatory Visit (INDEPENDENT_AMBULATORY_CARE_PROVIDER_SITE_OTHER): Payer: BC Managed Care – PPO | Admitting: Family Medicine

## 2013-02-13 ENCOUNTER — Encounter: Payer: Self-pay | Admitting: Family Medicine

## 2013-02-13 ENCOUNTER — Ambulatory Visit (INDEPENDENT_AMBULATORY_CARE_PROVIDER_SITE_OTHER)
Admission: RE | Admit: 2013-02-13 | Discharge: 2013-02-13 | Disposition: A | Discharge status: Home / Self Care | Payer: BC Managed Care – PPO | Source: Ambulatory Visit | Attending: Family Medicine | Admitting: Family Medicine

## 2013-02-13 VITALS — BP 102/70 | HR 65 | Temp 98.1°F | Ht 68.0 in | Wt 171.5 lb

## 2013-02-13 DIAGNOSIS — M25511 Pain in right shoulder: Secondary | ICD-10-CM

## 2013-02-13 DIAGNOSIS — M25519 Pain in unspecified shoulder: Secondary | ICD-10-CM

## 2013-02-13 NOTE — Patient Instructions (Addendum)
REFERRAL: GO THE THE FRONT ROOM AT THE ENTRANCE OF OUR CLINIC, NEAR CHECK IN. ASK FOR MARION. SHE WILL HELP YOU SET UP YOUR REFERRAL. DATE: TIME:  

## 2013-02-13 NOTE — Progress Notes (Signed)
[facility] at [facility] [address] [place] [address] Phone: [phone] Fax: [phone]  Date:  02/13/2013   Name:  Valerie Mcguire   DOB:  [birth year]   MRN:  [id number] Gender: female Age: 57 y.o.  Primary Physician:  Kerby Nora, MD  Evaluating MD: Hannah Beat, MD   Chief Complaint: Follow-up and Shoulder Pain   History of Present Illness:  Valerie Mcguire is a 57 y.o. pleasant patient who presents with the following:  R shoulder. DOI 11/24/2012  Very pleasant lady who injured her R shoulder almost 3 months ago now, initially had some loss of motion, but this progressively returned relatively quickly, and she has excellent native strength and UE strength. I last saw her about 6 weeks ago, and she has been very diligent working on [employer] and scapular stabilization. She was still having some impingement, so we did a subac injection. Impingement symptoms improved for 4 weeks or so, but now she appears as bad or potentially worse.   Patient Active Problem List   Diagnosis Date Noted  . Dysuria 10/16/2012  . Headache 07/12/2012  . Neuroma of lower extremity 04/12/2012  . Allergic rhinitis 03/01/2012  . Osteoarthritis of right knee 03/01/2012  . History of left below knee amputation 03/01/2012  . Narcolepsy 03/01/2012  . Lip lesion 03/01/2012    No past medical history on file.  Past Surgical History  Procedure Laterality Date  . Arthroscopic knee surgery, right  08/2010    History   Social History  . Marital Status: Unknown    Spouse Name: N/A    Number of Children: N/A  . Years of Education: N/A   Occupational History  . Not on file.   Social History Main Topics  . Smoking status: Never Smoker   . Smokeless tobacco: Never Used  . Alcohol Use: No  . Drug Use: No  . Sexually Active: No   Other Topics Concern  . Not on file   Social History Narrative   Divorced, single   Exercise: Gym daily.   Diet:Fruits and veggies.   Work as Personnel officer.    Family History  Problem Relation Age of Onset  . Arthritis Mother   . Hypertension Mother   . Hypertension Father   . Heart disease Father     No Known Allergies  Medication list has been reviewed and updated.  Outpatient Prescriptions Prior to Visit  Medication Sig Dispense Refill  . b complex vitamins capsule Take 1 capsule by mouth daily.      Marland Kitchen glucosamine-chondroitin 500-400 MG tablet Take 1 tablet by mouth daily.      Marland Kitchen Lysine 1000 MG TABS Take by mouth daily.      . Multiple Vitamin (MULTIVITAMIN) tablet Take 1 tablet by mouth daily.      . traMADol (ULTRAM) 50 MG tablet Take 1 tablet (50 mg total) by mouth every 6 (six) hours as needed for pain.  40 tablet  0   No facility-administered medications prior to visit.    Review of Systems:   GEN: No fevers, chills. Nontoxic. Primarily MSK c/o today. MSK: Detailed in the HPI GI: tolerating PO intake without difficulty Neuro: No numbness, parasthesias, or tingling associated. Otherwise the pertinent positives of the ROS are noted above.    Physical Examination: BP 102/70  Pulse 65  Temp(Src) 98.1 F (36.7 C) (Oral)  Ht 5\' 8"  (1.727 m)  Wt 171 lb 8 oz (77.792 kg)  BMI 26.08 kg/m2  SpO2 97%  Ideal Body Weight: Weight in (lb) to have BMI = 25: 164.1   GEN: Well-developed,well-nourished,in no acute distress; alert,appropriate and cooperative throughout examination HEENT: Normocephalic and atraumatic without obvious abnormalities. Ears, externally no deformities PULM: Breathing comfortably in no respiratory distress EXT: No clubbing, cyanosis, or edema PSYCH: Normally interactive. Cooperative during the interview. Pleasant. Friendly and conversant. Not anxious or depressed appearing. Normal, full affect.  Shoulder: R Inspection: Mild muscle wasting on R compared to L Ecchymosis/edema: neg  AC joint, scapula, clavicle: NT Cervical spine: NT, full ROM Spurling's: neg Abduction: 4+/5, loss  of 35 deg of motion compared to contralateral side. Flexion: 5/5, loss of 35 deg of motion comparedd to contralateral side IR, full, lift-off: 5/5 ER at neutral: full, 5/5 AC crossover: neg Neer: pos Hawkins: pos Drop Test: neg Empty Can: pos Supraspinatus insertion: mild-mod T Bicipital groove: NT Speed's: neg Yergason's: neg Sulcus sign: neg Scapular dyskinesis: MARKED ELEVATION OF THE RIGHT HEMISCAPULA WITH ABDUCTION ON THE RIGHT C5-T1 intact  Neuro: Sensation intact Grip 5/5   Dg Shoulder Right  02/13/2013   *RADIOLOGY REPORT*  Clinical Data: Right shoulder pain.  RIGHT SHOULDER - 2+ VIEW  Comparison: None.  Findings: No acute bony abnormality.  Specifically, no fracture, subluxation, or dislocation.  Soft tissues are intact.  IMPRESSION: No acute bony abnormality.   Original Report Authenticated By: Charlett Nose, M.D.    Assessment and Plan:  Right shoulder pain - Plan: DG Shoulder Right, MR Shoulder Right Wo Contrast  Obtain an MRI of the right shoulder without contrast to evaluate for potential rotator cuff tear. Failure to progress with 3 months of conservative treatment, dedicated supervised rotator cuff and scapular stabilization program, NSAIDS, subac injection x 1. Markedly altered mechanics. Concern for full thickness vs partial thickness rtc tear.  Orders Today:  Orders Placed This Encounter  Procedures  . DG Shoulder Right    Standing Status: Future     Number of Occurrences: 1     Standing Expiration Date: 04/15/2014    Order Specific Question:  Preferred imaging location?    Answer:  [facility]    Order Specific Question:  Reason for exam:    Answer:  right shoulder  . MR Shoulder Right Wo Contrast    [place]/epic order/bcbs/not claus/wt 171/not allergic to contrast/no needs    Standing Status: Future     Number of Occurrences:      Standing Expiration Date: 04/15/2014    Order Specific Question:  Does the patient have a pacemaker, internal devices,  implants, aneury    Answer:  No    Order Specific Question:  Preferred imaging location?    Answer:  [id number]    Order Specific Question:  Reason for exam:    Answer:  right shoulder, concern for rtc tear    Updated Medication List: (Includes new medications, updates to list, dose adjustments) No orders of the defined types were placed in this encounter.    Medications Discontinued: Medications Discontinued During This Encounter  Medication Reason  . traMADol (ULTRAM) 50 MG tablet Error      Signed, Kaeya Schiffer T. Mesiah Manzo, MD 02/13/2013 3:54 PM

## 2013-02-18 ENCOUNTER — Telehealth: Payer: Self-pay

## 2013-02-18 MED ORDER — HYDROCODONE-ACETAMINOPHEN 5-325 MG PO TABS: 1.0000 | ORAL_TABLET | ORAL | Status: DC | PRN

## 2013-02-18 NOTE — Telephone Encounter (Signed)
Hydrocodone-apap 5-325, 1 po q 4 hours prn pain, #30, 0 refills

## 2013-02-18 NOTE — Telephone Encounter (Signed)
Pt left v/m requesting different pain med for shoulder pain; Pt taking one tramadol at bedtime but not helping with pain so pt can sleep. [facility]  Pt has MRI scheduled on 02/19/13

## 2013-02-18 NOTE — Telephone Encounter (Signed)
Patient advised and rx called to pharmacy  

## 2013-02-21 ENCOUNTER — Ambulatory Visit
Admission: RE | Admit: 2013-02-21 | Discharge: 2013-02-21 | Disposition: A | Discharge status: Home / Self Care | Payer: BC Managed Care – PPO | Source: Ambulatory Visit | Attending: Family Medicine | Admitting: Family Medicine

## 2013-02-21 DIAGNOSIS — M25511 Pain in right shoulder: Secondary | ICD-10-CM

## 2013-02-22 ENCOUNTER — Other Ambulatory Visit: Payer: Self-pay | Admitting: Family Medicine

## 2013-02-22 ENCOUNTER — Telehealth: Payer: Self-pay

## 2013-02-22 DIAGNOSIS — M75111 Incomplete rotator cuff tear or rupture of right shoulder, not specified as traumatic: Secondary | ICD-10-CM

## 2013-02-22 DIAGNOSIS — M7551 Bursitis of right shoulder: Secondary | ICD-10-CM

## 2013-02-22 DIAGNOSIS — M7501 Adhesive capsulitis of right shoulder: Secondary | ICD-10-CM

## 2013-02-22 NOTE — Telephone Encounter (Signed)
Patient advised.

## 2013-02-22 NOTE — Telephone Encounter (Signed)
Pt left v/m wanting to know if pt should take her copy of MRI to Dr Thomasena Edis in [place], pt has seen Dr Thomasena Edis before and wants him to f/u shoulder pain; shoulder pain is worse at night but pain med is helping pt sleep; pt can raise her arm but it will not go behind head; pt is fearful of frozen shoulder; pt wants to know if any exercises or PT she should be doing. Pt request cb.

## 2013-02-22 NOTE — Telephone Encounter (Signed)
Call  i will call her later afternoon to tonight. Many findings.  No full thickness rotator cuff tear, which is the most important thing.  Valerie Mcguire has access to imaging.

## 2013-03-25 ENCOUNTER — Other Ambulatory Visit: Payer: Self-pay

## 2013-03-25 MED ORDER — CELECOXIB 200 MG PO CAPS: 200.0000 mg | ORAL_CAPSULE | Freq: Every day | ORAL | Status: DC

## 2013-03-25 NOTE — Telephone Encounter (Signed)
Done

## 2013-03-25 NOTE — Telephone Encounter (Signed)
Patient given 12 capsules to cover her for headache right know

## 2013-03-25 NOTE — Telephone Encounter (Signed)
Pt left v/m; pt scheduled for shoulder surgery 03/26/13; pt has migraine h/a for 2 days and surgeon's office advised pt to get celebrex for migraine h/a. Pt request refill of celebrex to [facility]. Please advise. Pt request cb when med called in.

## 2013-03-25 NOTE — Telephone Encounter (Signed)
Patient advised.

## 2013-03-25 NOTE — Telephone Encounter (Signed)
Pt left v/m wanted generic Celebrex; i spoke with Delice Bison at pharmacy and Celebrex does not come in generic and pt needs PA. I called pt to explain and pt said she has samples and to cancel request. Rema Fendt notified.-

## 2013-07-18 ENCOUNTER — Other Ambulatory Visit: Payer: Self-pay

## 2013-09-19 ENCOUNTER — Ambulatory Visit: Payer: BC Managed Care – PPO | Admitting: Internal Medicine

## 2014-01-10 ENCOUNTER — Telehealth: Payer: Self-pay | Admitting: Family Medicine

## 2014-01-10 DIAGNOSIS — Z89512 Acquired absence of left leg below knee: Secondary | ICD-10-CM

## 2014-01-10 NOTE — Telephone Encounter (Addendum)
Pt needs RX for 2 replacement liners for left leg prosthesis so that insurance will pay for it.  Please Fax to  # [phone]. Pt requests Cb [phone] when RX sent.

## 2014-01-10 NOTE — Telephone Encounter (Addendum)
[facility] notified order will be faxed by end of the day today.  Form faxed to [phone] per patient's request.

## 2014-01-13 NOTE — Telephone Encounter (Signed)
Pt spoke with [facility] this AM and they did not receive replacement liners order. Verified fax # [phone]. Pt request to refax order.

## 2014-01-13 NOTE — Telephone Encounter (Signed)
Order re-faxed to [phone].  [place] notified.

## 2014-02-28 ENCOUNTER — Telehealth: Payer: Self-pay

## 2014-02-28 NOTE — Telephone Encounter (Signed)
Rx written and in outbox. Let lpt know that I don't normally write rxs for prosthesis so if more detail is needed she may have to  Let me know more info or contact her amputation surgeon.

## 2014-02-28 NOTE — Telephone Encounter (Signed)
Spoke with patient and advised results [facility] to [facility] and scanned

## 2014-02-28 NOTE — Telephone Encounter (Signed)
Pt request 2 separate prescriptions; one prescription for new below the knee prosthesis to be made and another prescription for a new sleeve for  below the knee prosthesis that pt presently has. Pt request rxs to be faxed to [facility] in [place] fax # [phone].Pt last saw Dr Diona Browner on 04/12/12 and Dr Lorelei Pont on 02/13/13.Please advise.

## 2014-03-04 NOTE — Telephone Encounter (Signed)
Pt wanted status on rx for prosthesis and sleeve; [facility] faxed on 02-28-14; pt will ck with [facility].

## 2014-05-20 ENCOUNTER — Encounter: Payer: BC Managed Care – PPO | Admitting: Family Medicine

## 2015-07-02 ENCOUNTER — Other Ambulatory Visit: Payer: Self-pay | Admitting: Obstetrics and Gynecology

## 2015-07-07 LAB — CYTOLOGY - PAP

## 2015-10-13 DIAGNOSIS — C4491 Basal cell carcinoma of skin, unspecified: Secondary | ICD-10-CM

## 2015-10-13 HISTORY — DX: Basal cell carcinoma of skin, unspecified: C44.91

## 2017-04-19 ENCOUNTER — Other Ambulatory Visit: Payer: Self-pay | Admitting: Family Medicine

## 2017-04-19 DIAGNOSIS — Z1231 Encounter for screening mammogram for malignant neoplasm of breast: Secondary | ICD-10-CM

## 2017-05-02 ENCOUNTER — Ambulatory Visit
Admission: RE | Admit: 2017-05-02 | Discharge: 2017-05-02 | Disposition: A | Discharge status: Home / Self Care | Payer: BC Managed Care – PPO | Source: Ambulatory Visit | Attending: Family Medicine | Admitting: Family Medicine

## 2017-05-02 DIAGNOSIS — Z1231 Encounter for screening mammogram for malignant neoplasm of breast: Secondary | ICD-10-CM

## 2020-03-03 ENCOUNTER — Other Ambulatory Visit: Payer: Self-pay

## 2020-03-03 ENCOUNTER — Encounter: Payer: BC Managed Care – PPO | Admitting: Dermatology

## 2020-03-03 ENCOUNTER — Ambulatory Visit (INDEPENDENT_AMBULATORY_CARE_PROVIDER_SITE_OTHER): Payer: BC Managed Care – PPO | Admitting: Dermatology

## 2020-03-03 DIAGNOSIS — L738 Other specified follicular disorders: Secondary | ICD-10-CM | POA: Diagnosis not present

## 2020-03-03 DIAGNOSIS — Z85828 Personal history of other malignant neoplasm of skin: Secondary | ICD-10-CM | POA: Diagnosis not present

## 2020-03-03 DIAGNOSIS — L821 Other seborrheic keratosis: Secondary | ICD-10-CM

## 2020-03-03 DIAGNOSIS — L814 Other melanin hyperpigmentation: Secondary | ICD-10-CM

## 2020-03-03 DIAGNOSIS — Z1283 Encounter for screening for malignant neoplasm of skin: Secondary | ICD-10-CM | POA: Diagnosis not present

## 2020-03-03 DIAGNOSIS — L578 Other skin changes due to chronic exposure to nonionizing radiation: Secondary | ICD-10-CM

## 2020-03-03 DIAGNOSIS — D2271 Melanocytic nevi of right lower limb, including hip: Secondary | ICD-10-CM

## 2020-03-03 DIAGNOSIS — D229 Melanocytic nevi, unspecified: Secondary | ICD-10-CM

## 2020-03-03 DIAGNOSIS — Q825 Congenital non-neoplastic nevus: Secondary | ICD-10-CM

## 2020-03-03 DIAGNOSIS — L918 Other hypertrophic disorders of the skin: Secondary | ICD-10-CM

## 2020-03-03 DIAGNOSIS — D1801 Hemangioma of skin and subcutaneous tissue: Secondary | ICD-10-CM

## 2020-03-03 NOTE — Progress Notes (Signed)
Follow-Up Visit   Subjective  Valerie Mcguire is a 64 y.o. female who presents for the following: TBSE.  Patient presents today for an annual TBSE. Patient states she has a few dry area on forehead and a few skin tags that she would like to have removed. Patient has a h/o BCC Right nasal tip 10/13/2015  The following portions of the chart were reviewed this encounter and updated as appropriate:      Review of Systems:  No other skin or systemic complaints except as noted in HPI or Assessment and Plan.  Objective  Well appearing patient in no apparent distress; mood and affect are within normal limits.  A full examination was performed including scalp, head, eyes, ears, nose, lips, neck, chest, axillae, abdomen, back, buttocks, bilateral upper extremities, bilateral lower extremities, hands, feet, fingers, toes, fingernails, and toenails. All findings within normal limits unless otherwise noted below.  Objective  Right Tip of Nose: Well healed scar with no evidence of recurrence.   Objective  Forehead: Small yellow papules with a central dell.   Objective  BL Axilla: Fleshy, skin-colored pedunculated papules.    Objective  Spinal Back: Red patches  Objective  Right  Lateral Thigh: 5 mm med dark brown macule   Assessment & Plan  History of basal cell carcinoma (BCC) Right Tip of Nose  Clear. Observe for recurrence. Call clinic for new or changing lesions.  Recommend regular skin exams, daily broad-spectrum spf 30+ sunscreen use, and photoprotection.     Sebaceous hyperplasia Forehead  Benign, observe.    Achrochordon BL Axilla  Acrochordons (Skin Tags) - Removal desired by patient - Fleshy, skin-colored pedunculated papules - Benign appearing.  - Patient desires removal. Reviewed that this is not covered by insurance and they will be charged a cosmetic fee for removal. Patient signed non-covered consent.  - Prior to the procedure, reviewed the expected  small wound. Also reviewed the risk of leaving a small scar and the small risk of infection.  PROCEDURE - The areas were prepped with isopropyl alcohol. A small amount of lidocaine 1% with epinephrine was injected at the base of each lesion to achieve good local anesthesia. The skin tags were removed using a snip technique. Aluminum chloride was used for hemostasis. Petrolatum and a bandage were applied. The procedure was tolerated well. - Wound care was reviewed with the patient. They were advised to call with any concerns. Total number of treated acrochordons 12   Vascular birthmark Spinal Back  Benign, observe.    Nevus Right  Lateral Thigh  Benign-appearing.  Observation.  Call clinic for new or changing moles.  Recommend daily use of broad spectrum spf 30+ sunscreen to sun-exposed areas.     Lentigines - Scattered tan macules - Discussed due to sun exposure - Benign, observe - Call for any changes  Seborrheic Keratoses - Stuck-on, waxy, tan-brown papules and plaques  - Discussed benign etiology and prognosis. - Observe - Call for any changes  Melanocytic Nevi - Tan-brown and/or pink-flesh-colored symmetric macules and papules - Benign appearing on exam today - Observation - Call clinic for new or changing moles - Recommend daily use of broad spectrum spf 30+ sunscreen to sun-exposed areas.   Hemangiomas - Red papules - Discussed benign nature - Observe - Call for any changes  Actinic Damage - diffuse erythematous macules with underlying dyspigmentation - Recommend daily broad spectrum sunscreen SPF 30+ to sun-exposed areas, reapply every 2 hours as needed.  - Call for new  or changing lesions.   Skin cancer screening performed today.   Return in about 1 year (around 03/03/2021) for TBSE.   I, Donzetta Kohut, CMA, am acting as scribe for Brendolyn Patty, MD .  Documentation: I have reviewed the above documentation for accuracy and completeness, and I agree with  the above.  Brendolyn Patty MD

## 2020-03-03 NOTE — Patient Instructions (Addendum)

## 2020-08-03 ENCOUNTER — Other Ambulatory Visit: Payer: Self-pay | Admitting: Family Medicine

## 2020-08-05 ENCOUNTER — Other Ambulatory Visit: Payer: Self-pay | Admitting: Family Medicine

## 2020-08-05 DIAGNOSIS — Z1231 Encounter for screening mammogram for malignant neoplasm of breast: Secondary | ICD-10-CM

## 2020-08-23 LAB — COLOGUARD: COLOGUARD: POSITIVE — AB

## 2020-09-22 ENCOUNTER — Ambulatory Visit
Admission: RE | Admit: 2020-09-22 | Discharge: 2020-09-22 | Disposition: A | Discharge status: Home / Self Care | Payer: BC Managed Care – PPO | Source: Ambulatory Visit | Attending: Family Medicine | Admitting: Family Medicine

## 2020-09-22 ENCOUNTER — Other Ambulatory Visit: Payer: Self-pay

## 2020-09-22 DIAGNOSIS — Z1231 Encounter for screening mammogram for malignant neoplasm of breast: Secondary | ICD-10-CM | POA: Insufficient documentation

## 2021-03-16 ENCOUNTER — Other Ambulatory Visit: Payer: Self-pay

## 2021-03-16 ENCOUNTER — Ambulatory Visit: Payer: Medicare Other | Admitting: Dermatology

## 2021-03-16 DIAGNOSIS — D2271 Melanocytic nevi of right lower limb, including hip: Secondary | ICD-10-CM | POA: Diagnosis not present

## 2021-03-16 DIAGNOSIS — L821 Other seborrheic keratosis: Secondary | ICD-10-CM

## 2021-03-16 DIAGNOSIS — L814 Other melanin hyperpigmentation: Secondary | ICD-10-CM

## 2021-03-16 DIAGNOSIS — D1800 Hemangioma unspecified site: Secondary | ICD-10-CM

## 2021-03-16 DIAGNOSIS — L570 Actinic keratosis: Secondary | ICD-10-CM | POA: Diagnosis not present

## 2021-03-16 DIAGNOSIS — D692 Other nonthrombocytopenic purpura: Secondary | ICD-10-CM

## 2021-03-16 DIAGNOSIS — D3610 Benign neoplasm of peripheral nerves and autonomic nervous system, unspecified: Secondary | ICD-10-CM

## 2021-03-16 DIAGNOSIS — D229 Melanocytic nevi, unspecified: Secondary | ICD-10-CM

## 2021-03-16 DIAGNOSIS — Q825 Congenital non-neoplastic nevus: Secondary | ICD-10-CM

## 2021-03-16 DIAGNOSIS — Z1283 Encounter for screening for malignant neoplasm of skin: Secondary | ICD-10-CM | POA: Diagnosis not present

## 2021-03-16 DIAGNOSIS — L578 Other skin changes due to chronic exposure to nonionizing radiation: Secondary | ICD-10-CM

## 2021-03-16 DIAGNOSIS — L689 Hypertrichosis, unspecified: Secondary | ICD-10-CM

## 2021-03-16 DIAGNOSIS — Z85828 Personal history of other malignant neoplasm of skin: Secondary | ICD-10-CM

## 2021-03-16 NOTE — Patient Instructions (Addendum)

## 2021-03-16 NOTE — Progress Notes (Signed)
Follow-Up Visit   Subjective  Valerie Mcguire is a 65 y.o. female who presents for the following: Total body skin exam (Hx of BCC R nasal tip).   The following portions of the chart were reviewed this encounter and updated as appropriate:        Review of Systems:  No other skin or systemic complaints except as noted in HPI or Assessment and Plan.  Objective  Well appearing patient in no apparent distress; mood and affect are within normal limits.  A full examination was performed including scalp, head, eyes, ears, nose, lips, neck, chest, axillae, abdomen, back, buttocks, bilateral upper extremities, bilateral lower extremities, hands, feet, fingers, toes, fingernails, and toenails. All findings within normal limits unless otherwise noted below.    R lat thigh 5.70mm med dark brown flat pap slightly raised  Left Antecubitum 3.73mm firm tan white pap  R ant shoulder 4.72mm soft flesh pap  spinal back Red patches  R upper forehead x 1 Pink scaly macules   face Hypertrichosis upper lip, chin with white hairs   Assessment & Plan   History of Basal Cell Carcinoma of the Skin - No evidence of recurrence today - Recommend regular full body skin exams - Recommend daily broad spectrum sunscreen SPF 30+ to sun-exposed areas, reapply every 2 hours as needed.  - Call if any new or changing lesions are noted between office visits   Lentigines - Scattered tan macules - Due to sun exposure - Benign-appering, observe - Recommend daily broad spectrum sunscreen SPF 30+ to sun-exposed areas, reapply every 2 hours as needed. - Call for any changes  Seborrheic Keratoses - Stuck-on, waxy, tan-brown papules and/or plaques  - Benign-appearing - Discussed benign etiology and prognosis. - Observe - Call for any changes - R sternum Melanocytic Nevi - Tan-brown and/or pink-flesh-colored symmetric macules and papules - Benign appearing on exam today - Observation - Call  clinic for new or changing moles - Recommend daily use of broad spectrum spf 30+ sunscreen to sun-exposed areas.   Hemangiomas - Red papules - Discussed benign nature - Observe - Call for any changes  Actinic Damage - Chronic condition, secondary to cumulative UV/sun exposure - diffuse scaly erythematous macules with underlying dyspigmentation - Recommend daily broad spectrum sunscreen SPF 30+ to sun-exposed areas, reapply every 2 hours as needed.  - Staying in the shade or wearing long sleeves, sun glasses (UVA+UVB protection) and wide brim hats (4-inch brim around the entire circumference of the hat) are also recommended for sun protection.  - Call for new or changing lesions.  Skin cancer screening performed today.  Purpura - Chronic; persistent and recurrent.  Treatable, but not curable. - Violaceous macules and patches - Benign - Related to trauma, age, sun damage and/or use of blood thinners, chronic use of topical and/or oral steroids - Observe - Can use OTC arnica containing moisturizer such as Dermend Bruise Formula if desired - Call for worsening or other concerns  Nevus R lat thigh  Benign-appearing.  Stable. Observation.  Call clinic for new or changing moles.  Recommend daily use of broad spectrum spf 30+ sunscreen to sun-exposed areas.    Seborrheic keratosis Left Antecubitum  Vs Cyst  Benign, observe  Neurofibroma R ant shoulder  Vs Nevus  Benign appearing, observe  Vascular birthmark spinal back  Benign, observe  AK (actinic keratosis) R upper forehead x 1  Destruction of lesion - R upper forehead x 1  Destruction method: cryotherapy   Informed consent:  discussed and consent obtained   Lesion destroyed using liquid nitrogen: Yes   Region frozen until ice ball extended beyond lesion: Yes   Outcome: patient tolerated procedure well with no complications   Post-procedure details: wound care instructions given   Additional details:  Prior to  procedure, discussed risks of blister formation, small wound, skin dyspigmentation, or rare scar following cryotherapy. Recommend Vaseline ointment to treated areas while healing.   Hypertrichosis face  Recommend electrolysis since the hairs are white, and LHR not effective. Recommend Izola Price at [facility]  Return in about 1 year (around 03/16/2022) for TBSE, Hx of BCC, Hx of AKs.  I, Othelia Pulling, RMA, am acting as scribe for Brendolyn Patty, MD .  Documentation: I have reviewed the above documentation for accuracy and completeness, and I agree with the above.  Brendolyn Patty MD

## 2021-08-17 ENCOUNTER — Ambulatory Visit: Payer: Medicare Other | Admitting: Surgery

## 2021-08-26 ENCOUNTER — Other Ambulatory Visit: Payer: Self-pay

## 2021-08-26 ENCOUNTER — Ambulatory Visit: Payer: Medicare Other | Admitting: Surgery

## 2021-08-26 ENCOUNTER — Encounter: Payer: Self-pay | Admitting: Surgery

## 2021-08-26 VITALS — BP 103/70 | HR 72 | Temp 98.3°F | Ht 68.0 in | Wt 172.8 lb

## 2021-08-26 DIAGNOSIS — Z1211 Encounter for screening for malignant neoplasm of colon: Secondary | ICD-10-CM | POA: Diagnosis not present

## 2021-08-26 DIAGNOSIS — K640 First degree hemorrhoids: Secondary | ICD-10-CM | POA: Diagnosis not present

## 2021-08-26 DIAGNOSIS — G43909 Migraine, unspecified, not intractable, without status migrainosus: Secondary | ICD-10-CM | POA: Insufficient documentation

## 2021-08-26 MED ORDER — SUTAB 1479-225-188 MG PO TABS: 12.0000 | ORAL_TABLET | Freq: Once | ORAL | 0 refills | Status: AC

## 2021-08-26 NOTE — Progress Notes (Signed)
Gastroenterology Pre-Procedure Review  Request Date: 10/08/2021 Requesting Physician: Dr. Marius Ditch  PATIENT REVIEW QUESTIONS: The patient responded to the following health history questions as indicated:    1. Are you having any GI issues? no 2. Do you have a personal history of Polyps? no 3. Do you have a family history of Colon Cancer or Polyps? no 4. Diabetes Mellitus? no 5. Joint replacements in the past 12 months?no 6. Major health problems in the past 3 months?no 7. Any artificial heart valves, MVP, or defibrillator?no    MEDICATIONS & ALLERGIES:    Patient reports the following regarding taking any anticoagulation/antiplatelet therapy:   Plavix, Coumadin, Eliquis, Xarelto, Lovenox, Pradaxa, Brilinta, or Effient? no Aspirin? no  Patient confirms/reports the following medications:  Current Outpatient Medications  Medication Sig Dispense Refill   b complex vitamins capsule Take 1 capsule by mouth daily.     butalbital-acetaminophen-caffeine (FIORICET) 50-325-40 MG tablet Take 1 tablet by mouth every 4 (four) hours as needed.     glucosamine-chondroitin 500-400 MG tablet Take 1 tablet by mouth daily.     Multiple Vitamin (MULTIVITAMIN) tablet Take 1 tablet by mouth daily.     rizatriptan (MAXALT-MLT) 10 MG disintegrating tablet Take 10 mg by mouth 3 (three) times daily as needed.     No current facility-administered medications for this visit.    Patient confirms/reports the following allergies:  No Known Allergies  No orders of the defined types were placed in this encounter.   AUTHORIZATION INFORMATION Primary Insurance: 1D#: Group #:  Secondary Insurance: 1D#: Group #:  SCHEDULE INFORMATION: Date: 10/08/2021 Time: Location: [place]

## 2021-08-26 NOTE — Patient Instructions (Addendum)
We will send a referral to [facility] for a colonoscopy. They will contact you to schedule this.   Advised to pursue a goal of 25 to 30 g of fiber daily.  Made aware that the majority of this may be through natural sources, but advised to be aware of actual consumption and to ensure minimal consumption by daily supplementation.  Various forms of supplements discussed.(Fiber powder-Benefiber/Metamucil, fiber gummies, psyllium fiber)  Strongly advised to consume more fluids to ensure adequate hydration, instructed to watch color of urine to determine adequacy of hydration.  Clarity is pursued in urine output, and bowel activity that correlates to significant meal intake.  Patient is to avoid deferring having bowel movements, advised to take the time at the first sign of sensation, typically following meals and in the morning.  Subsequent utilization of MiraLAX to ensure at least daily movement, ideally twice daily bowel movements.  If multiple doses of MiraLAX are necessary utilize them.     Hemorrhoids Hemorrhoids are swollen veins that may develop: In the butt (rectum). These are called internal hemorrhoids. Around the opening of the butt (anus). These are called external hemorrhoids. Hemorrhoids can cause pain, itching, or bleeding. Most of the time, they do not cause serious problems. They usually get better with diet changes, lifestyle changes, and other home treatments. What are the causes? This condition may be caused by: Having trouble pooping (constipation). Pushing hard (straining) to poop. Watery poop (diarrhea). Pregnancy. Being very overweight (obese). Sitting for long periods of time. Heavy lifting or other activity that causes you to strain. Anal sex. Riding a bike for a long period of time. What are the signs or symptoms? Symptoms of this condition include: Pain. Itching or soreness in the butt. Bleeding from the butt. Leaking poop. Swelling in the area. One  or more lumps around the opening of your butt. How is this diagnosed? A doctor can often diagnose this condition by looking at the affected area. The doctor may also: Do an exam that involves feeling the area with a gloved hand (digital rectal exam). Examine the area inside your butt using a small tube (anoscope). Order blood tests. This may be done if you have lost a lot of blood. Have you get a test that involves looking inside the colon using a flexible tube with a camera on the end (sigmoidoscopy or colonoscopy). How is this treated? This condition can usually be treated at home. Your doctor may tell you to change what you eat, make lifestyle changes, or try home treatments. If these do not help, procedures can be done to remove the hemorrhoids or make them smaller. These may involve: Placing rubber bands at the base of the hemorrhoids to cut off their blood supply. Injecting medicine into the hemorrhoids to shrink them. Shining a type of light energy onto the hemorrhoids to cause them to fall off. Doing surgery to remove the hemorrhoids or cut off their blood supply. Follow these instructions at home: Eating and drinking  Eat foods that have a lot of fiber in them. These include whole grains, beans, nuts, fruits, and vegetables. Ask your doctor about taking products that have added fiber (fibersupplements). Reduce the amount of fat in your diet. You can do this by: Eating low-fat dairy products. Eating less red meat. Avoiding processed foods. Drink enough fluid to keep your pee (urine) pale yellow. Managing pain and swelling  Take a warm-water bath (sitz bath) for 20 minutes to ease pain. Do this 3-4 times a  day. You may do this in a bathtub or using a portable sitz bath that fits over the toilet. If told, put ice on the painful area. It may be helpful to use ice between your warm baths. Put ice in a plastic bag. Place a towel between your skin and the bag. Leave the ice on for 20  minutes, 2-3 times a day. General instructions Take over-the-counter and prescription medicines only as told by your doctor. Medicated creams and medicines may be used as told. Exercise often. Ask your doctor how much and what kind of exercise is best for you. Go to the bathroom when you have the urge to poop. Do not wait. Avoid pushing too hard when you poop. Keep your butt dry and clean. Use wet toilet paper or moist towelettes after pooping. Do not sit on the toilet for a long time. Keep all follow-up visits as told by your doctor. This is important. Contact a doctor if you: Have pain and swelling that do not get better with treatment or medicine. Have trouble pooping. Cannot poop. Have pain or swelling outside the area of the hemorrhoids. Get help right away if you have: Bleeding that will not stop. Summary Hemorrhoids are swollen veins in the butt or around the opening of the butt. They can cause pain, itching, or bleeding. Eat foods that have a lot of fiber in them. These include whole grains, beans, nuts, fruits, and vegetables. Take a warm-water bath (sitz bath) for 20 minutes to ease pain. Do this 3-4 times a day. This information is not intended to replace advice given to you by your health care provider. Make sure you discuss any questions you have with your health care provider. Document Revised: 03/10/2021 Document Reviewed: 03/10/2021 Elsevier Patient Education  [place].

## 2021-08-26 NOTE — Progress Notes (Signed)
Patient ID: Valerie Mcguire, female   DOB: 17-Aug-1956, 65 y.o.   MRN: [id number]  Chief Complaint: Hemorrhoidal pain, burning, itching.  History of Present Illness Valerie Mcguire is a 65 y.o. female with a 10-year history of the presence of hemorrhoids.  She has no prior history of thrombosis and no anal procedures completed.  She has not had a screening colonoscopy.  Though she denies constipation, she does admit to having to strain on occasion and when she does this that is when her flareups occur.  She has not utilized any fiber supplementation or other laxatives.  She reports she has a minimal amount of bright red bleeding when her flareups occur.  She has not utilize sitz bath's, but does use preparation H which works quite well when she has the burning and itching.  Past Medical History Past Medical History:  Diagnosis Date   Basal cell carcinoma 10/13/2015   R nasal tip       Past Surgical History:  Procedure Laterality Date   arthroscopic knee surgery, right  08/2010    No Known Allergies  Current Outpatient Medications  Medication Sig Dispense Refill   b complex vitamins capsule Take 1 capsule by mouth daily.     butalbital-acetaminophen-caffeine (FIORICET) 50-325-40 MG tablet Take 1 tablet by mouth every 4 (four) hours as needed.     celecoxib (CELEBREX) 200 MG capsule Take 1 capsule (200 mg total) by mouth daily. 30 capsule 0   glucosamine-chondroitin 500-400 MG tablet Take 1 tablet by mouth daily.     HYDROcodone-acetaminophen (NORCO/VICODIN) 5-325 MG per tablet Take 1 tablet by mouth every 4 (four) hours as needed for pain. (Patient not taking: Reported on 03/16/2021) 30 tablet 0   Lysine 1000 MG TABS Take by mouth daily.     Multiple Vitamin (MULTIVITAMIN) tablet Take 1 tablet by mouth daily.     rizatriptan (MAXALT-MLT) 10 MG disintegrating tablet Take 10 mg by mouth 3 (three) times daily as needed.     No current facility-administered medications for this visit.     Family History Family History  Problem Relation Age of Onset   Arthritis Mother    Hypertension Mother    Hypertension Father    Heart disease Father    Breast cancer Cousin        maternal side      Social History Social History   Tobacco Use   Smoking status: Never   Smokeless tobacco: Never  Substance Use Topics   Alcohol use: No   Drug use: No        Review of Systems  Constitutional: Negative.   HENT: Negative.    Eyes: Negative.   Respiratory: Negative.    Cardiovascular: Negative.   Gastrointestinal: Negative.   Genitourinary: Negative.   Skin: Negative.   Neurological:  Positive for dizziness and headaches.  Psychiatric/Behavioral: Negative.       Physical Exam Height $RemoveBef'5\' 8"'[id number]$  (1.727 m), weight 172 lb 12.8 oz (78.4 kg). Last Weight  Most recent update: 08/26/2021  8:58 AM    Weight  78.4 kg (172 lb 12.8 oz)             CONSTITUTIONAL: Well developed, and nourished, appropriately responsive and aware without distress.   EYES: Sclera non-icteric.   EARS, NOSE, MOUTH AND THROAT: Mask worn.   Hearing is intact to voice.  NECK: Trachea is midline, and there is no jugular venous distension.  LYMPH NODES:  Lymph nodes in the neck are not enlarged. RESPIRATORY:  Lungs are clear, and breath sounds are equal bilaterally. Normal respiratory effort without pathologic use of accessory muscles. CARDIOVASCULAR: Heart is regular in rate and rhythm. GI: The abdomen is soft, nontender, and nondistended.  GU: Caryl-Lyn is present as chaperone.  External anal exam is notable for minimal supple external hemorrhoidal skin redundancy circumferentially, with an absence at 12:00(coccyx).  DRE confirms circumferential grade 1 hemorrhoids, without redundancy or specific piles present.  There is no distal rectal lesions on palpation.  No appreciable remarkable rectocele. MUSCULOSKELETAL:  Symmetrical muscle tone appreciated in all four extremities.    SKIN: Skin turgor is  normal. No pathologic skin lesions appreciated.  NEUROLOGIC:  Motor and sensation appear grossly normal.  Cranial nerves are grossly without defect. PSYCH:  Alert and oriented to person, place and time. Affect is appropriate for situation.  Data Reviewed I have personally reviewed what is currently available of the patient's imaging, recent labs and medical records.   Labs:  No flowsheet data found. CMP Latest Ref Rng & Units 04/12/2012  Glucose 70 - 99 mg/dL 84  BUN 6 - 23 mg/dL 18  Creatinine 0.4 - 1.2 mg/dL 1.0  Sodium 135 - 145 mEq/L 139  Potassium 3.5 - 5.1 mEq/L 4.1  Chloride 96 - 112 mEq/L 103  CO2 19 - 32 mEq/L 27  Calcium 8.4 - 10.5 mg/dL 9.2  Total Protein 6.0 - 8.3 g/dL 6.3  Total Bilirubin 0.3 - 1.2 mg/dL 0.9  Alkaline Phos 39 - 117 U/L 64  AST 0 - 37 U/L 23  ALT 0 - 35 U/L 19      Imaging:  Within last 24 hrs: No results found.  Assessment    Grade 1 hemorrhoids. 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  Made aware that the majority of this may be through natural sources, but advised to be aware of actual consumption and to ensure minimal consumption by daily supplementation.  Various forms of supplements discussed.  Strongly advised to consume more fluids to ensure adequate hydration, instructed to watch color of urine to determine adequacy of hydration.  Clarity is pursued in urine output, and bowel activity that correlates to significant meal intake.  Patient is to avoid deferring having bowel movements, advised to take the time at the first sign of sensation, typically following meals and in the morning.  Subsequent  utilization of MiraLAX to ensure at least daily movement, ideally twice daily bowel movements.  If multiple doses of MiraLAX are necessary utilize them.  I advised that the goals of therapy are to make sure that her bowel movements never require any additional efforts.  Also encourage utilization of possible bidet or bidet like toilet seat as well for hygiene.  I feel her goals of treatment currently should be easily met with medical management  Strongly encouraged her to proceed with screening colonoscopy.  Face-to-face time spent with the patient and accompanying care providers(if present) was 35 minutes, with more than 50% of the time spent counseling, educating, and coordinating care of the patient.    These notes generated with voice recognition software. I apologize for  typographical errors.  Ronny Bacon M.D., FACS 08/26/2021, 8:58 AM

## 2021-08-30 ENCOUNTER — Telehealth: Payer: Self-pay

## 2021-08-30 NOTE — Telephone Encounter (Signed)
Returned patients call. Had questions regarding a text message she keeps receiving from a pharmacy. Patient states she has already picked up her prep from [facility]. I told her that may be a spasm message and just to ignore it. Pt verbalized understanding.

## 2021-10-07 ENCOUNTER — Encounter: Payer: Self-pay | Admitting: Gastroenterology

## 2021-10-08 ENCOUNTER — Ambulatory Visit
Admission: RE | Admit: 2021-10-08 | Discharge: 2021-10-08 | Disposition: A | Discharge status: Home / Self Care | Payer: Medicare Other | Attending: Gastroenterology | Admitting: Gastroenterology

## 2021-10-08 ENCOUNTER — Ambulatory Visit: Payer: Medicare Other | Admitting: Certified Registered"

## 2021-10-08 ENCOUNTER — Encounter
Admission: RE | Disposition: A | Discharge status: Home / Self Care | Payer: Self-pay | Source: Home / Self Care | Attending: Gastroenterology

## 2021-10-08 ENCOUNTER — Encounter: Payer: Self-pay | Admitting: Gastroenterology

## 2021-10-08 DIAGNOSIS — D123 Benign neoplasm of transverse colon: Secondary | ICD-10-CM | POA: Diagnosis not present

## 2021-10-08 DIAGNOSIS — D122 Benign neoplasm of ascending colon: Secondary | ICD-10-CM | POA: Diagnosis not present

## 2021-10-08 DIAGNOSIS — K648 Other hemorrhoids: Secondary | ICD-10-CM | POA: Diagnosis not present

## 2021-10-08 DIAGNOSIS — Z1211 Encounter for screening for malignant neoplasm of colon: Secondary | ICD-10-CM | POA: Diagnosis not present

## 2021-10-08 DIAGNOSIS — D128 Benign neoplasm of rectum: Secondary | ICD-10-CM | POA: Insufficient documentation

## 2021-10-08 DIAGNOSIS — Z20822 Contact with and (suspected) exposure to covid-19: Secondary | ICD-10-CM | POA: Diagnosis not present

## 2021-10-08 DIAGNOSIS — R195 Other fecal abnormalities: Secondary | ICD-10-CM | POA: Diagnosis not present

## 2021-10-08 DIAGNOSIS — K5730 Diverticulosis of large intestine without perforation or abscess without bleeding: Secondary | ICD-10-CM | POA: Diagnosis not present

## 2021-10-08 DIAGNOSIS — Z89512 Acquired absence of left leg below knee: Secondary | ICD-10-CM | POA: Insufficient documentation

## 2021-10-08 DIAGNOSIS — K644 Residual hemorrhoidal skin tags: Secondary | ICD-10-CM | POA: Diagnosis not present

## 2021-10-08 DIAGNOSIS — D124 Benign neoplasm of descending colon: Secondary | ICD-10-CM | POA: Insufficient documentation

## 2021-10-08 DIAGNOSIS — D126 Benign neoplasm of colon, unspecified: Secondary | ICD-10-CM

## 2021-10-08 DIAGNOSIS — Z85828 Personal history of other malignant neoplasm of skin: Secondary | ICD-10-CM | POA: Insufficient documentation

## 2021-10-08 HISTORY — PX: COLONOSCOPY WITH PROPOFOL: SHX5780

## 2021-10-08 LAB — SARS CORONAVIRUS 2 (TAT 6-24 HRS): SARS Coronavirus 2: NEGATIVE

## 2021-10-08 SURGERY — COLONOSCOPY WITH PROPOFOL
Anesthesia: General

## 2021-10-08 MED ORDER — PROPOFOL 500 MG/50ML IV EMUL
INTRAVENOUS | Status: DC | PRN
  Administered 2021-10-08: 150 ug/kg/min via INTRAVENOUS

## 2021-10-08 MED ORDER — IPRATROPIUM-ALBUTEROL 0.5-2.5 (3) MG/3ML IN SOLN
RESPIRATORY_TRACT | Status: AC
  Filled 2021-10-08: qty 3

## 2021-10-08 MED ORDER — LIDOCAINE HCL (CARDIAC) PF 100 MG/5ML IV SOSY
PREFILLED_SYRINGE | INTRAVENOUS | Status: DC | PRN
  Administered 2021-10-08: 50 mg via INTRAVENOUS

## 2021-10-08 MED ORDER — SODIUM CHLORIDE 0.9 % IV SOLN
INTRAVENOUS | Status: DC
  Administered 2021-10-08: 20 mL/h via INTRAVENOUS

## 2021-10-08 MED ORDER — PROPOFOL 10 MG/ML IV BOLUS
INTRAVENOUS | Status: DC | PRN
  Administered 2021-10-08: 50 mg via INTRAVENOUS

## 2021-10-08 NOTE — OR Nursing (Signed)
DR Wynetta Emery HAS CLEARED PT FOR DISCHARGE. VSS. NO FURTHER O2 REQUIREMENT. PT CONTINUES TO EXPERIENCED COUGH AND CONGESTION AND SORE THROAT. DR Glennallen TEST STAT PRIOR TO DISCHARGE. SDS TECH NOTIFIED. SISTER PEGGY INFORMED OF STATUS AND AWAITING COVID RESULTS

## 2021-10-08 NOTE — Op Note (Signed)
[facility] Gastroenterology Patient Name: Valerie Mcguire Procedure Date: 10/08/2021 8:15 AM MRN: [id number] Account #: [id number] Date of Birth: 20-May-1956 Admit Type: Outpatient Age: 66 Room: [facility] ENDO ROOM 2 Gender: Female Note Status: Finalized Instrument Name: Colonoscope [id number] Procedure:             Colonoscopy Indications:           This is the patient's first colonoscopy, Positive                         Cologuard test Providers:             Lin Landsman MD, MD Referring MD:          Jordan Likes. Lavena Bullion (Referring MD) Medicines:             General Anesthesia Complications:         No immediate complications. Estimated blood loss:                         Minimal. Procedure:             Pre-Anesthesia Assessment:                        - Prior to the procedure, a History and Physical was                         performed, and patient medications and allergies were                         reviewed. The patient is competent. The risks and                         benefits of the procedure and the sedation options and                         risks were discussed with the patient. All questions                         were answered and informed consent was obtained.                         Patient identification and proposed procedure were                         verified by the physician, the nurse, the                         anesthesiologist, the anesthetist and the technician                         in the pre-procedure area in the procedure room in the                         endoscopy suite. Mental Status Examination: alert and                         oriented. Airway Examination: normal oropharyngeal  airway and neck mobility. Respiratory Examination:                         clear to auscultation. CV Examination: normal.                         Prophylactic Antibiotics: The patient does not require                          prophylactic antibiotics. Prior Anticoagulants: The                         patient has taken no previous anticoagulant or                         antiplatelet agents. ASA Grade Assessment: III - A                         patient with severe systemic disease. After reviewing                         the risks and benefits, the patient was deemed in                         satisfactory condition to undergo the procedure. The                         anesthesia plan was to use general anesthesia.                         Immediately prior to administration of medications,                         the patient was re-assessed for adequacy to receive                         sedatives. The heart rate, respiratory rate, oxygen                         saturations, blood pressure, adequacy of pulmonary                         ventilation, and response to care were monitored                         throughout the procedure. The physical status of the                         patient was re-assessed after the procedure.                        After obtaining informed consent, the colonoscope was                         passed under direct vision. Throughout the procedure,                         the patient's blood pressure, pulse, and oxygen  saturations were monitored continuously. The                         Colonoscope was introduced through the anus and                         advanced to the the cecum, identified by appendiceal                         orifice and ileocecal valve. The quality of the bowel                         preparation was fair. The colonoscopy was technically                         difficult and complex due to multiple diverticula in                         the colon. Successful completion of the procedure was                         aided by withdrawing the scope and replacing with the                         pediatric colonoscope and applying abdominal  pressure.                         The patient tolerated the procedure fairly well. The                         patient tolerated the procedure poorly due to the                         patient's respiratory instability (hypoxia) which got                         corrected by withdrawal of scope, weaning sedation and                         oxygenation with ambu bag. Procedure completed with                         light sedation Findings:      The perianal and digital rectal examinations were normal. Pertinent       negatives include normal sphincter tone and no palpable rectal lesions.      Hemorrhoids were found on perianal exam.      Seven sessile polyps were found in the rectum 2, descending colon 1,       transverse colon 2 and ascending colon 2. The polyps were 4 to 8 mm in       size. These polyps were removed with a cold snare. Polyp resection was       incomplete, and the resected tissue was partially retrieved. Estimated       blood loss was minimal. To prevent bleeding after the polypectomy, one       hemostatic clip was successfully placed (MR conditional) in the rectum.       There was no bleeding at  the end of the procedure.      Multiple small and large-mouthed diverticula were found in the       recto-sigmoid colon and sigmoid colon. There was no evidence of       diverticular bleeding.      Non-bleeding external and internal hemorrhoids were found during       retroflexion. The hemorrhoids were large. Impression:            - Preparation of the colon was fair.                        - Hemorrhoids found on perianal exam.                        - Seven 4 to 8 mm polyps in the rectum, in the                         descending colon, in the transverse colon and in the                         ascending colon, removed with a cold snare. Polyp                         resection was incomplete, and the resected tissue was                         partially retrieved. Clip (MR  conditional) was placed.                        - Severe diverticulosis in the recto-sigmoid colon and                         in the sigmoid colon. There was no evidence of                         diverticular bleeding.                        - Non-bleeding external and internal hemorrhoids. Recommendation:        - Discharge patient to home (with escort).                        - Resume previous diet today.                        - Continue present medications.                        - Await pathology results.                        - Repeat colonoscopy in 3 years for surveillance of                         multiple polyps. Procedure Code(s):     --- Professional ---                        [phone], Colonoscopy, flexible; with removal of  tumor(s), polyp(s), or other lesion(s) by snare                         technique Diagnosis Code(s):     --- Professional ---                        K64.8, Other hemorrhoids                        K62.1, Rectal polyp                        K63.5, Polyp of colon                        R19.5, Other fecal abnormalities                        K57.30, Diverticulosis of large intestine without                         perforation or abscess without bleeding CPT copyright 2019 [employer]. All rights reserved. The codes documented in this report are preliminary and upon coder review may  be revised to meet current compliance requirements. Dr. Ulyess Mort Lin Landsman MD, MD 10/08/2021 9:17:11 AM This report has been signed electronically. Number of Addenda: 0 Note Initiated On: 10/08/2021 8:15 AM Scope Withdrawal Time: 0 hours 33 minutes 51 seconds  Total Procedure Duration: 0 hours 45 minutes 59 seconds  Estimated Blood Loss:  Estimated blood loss was minimal. Estimated blood loss                         was minimal.      [facility]

## 2021-10-08 NOTE — OR Nursing (Signed)
RN ENTERED BAY TO FIND MONITOR ALARMING AND O2 SAT DOWN TO 77. PT NOT COUGHING ibuprofen/S ON DEEP BREATHING. SAT ONLY UP TO 82. O2 APPLIED 4 LITERS NASAL CANNULA. DR Wynetta Emery NOTIFIED. IN TO EVALUATION PT.MD REMOVED O2  AND I/S RN TO CONTINUE TO MONITOR PATIENT AND ENCOURAGE DEEP BREATHING. PT HAS BEEN EXPERINCIING COUGHING SINCE AWAKENING. BP ON LOWER SIDE SYSTOLIC 72-550. NS CONTINUES TO INFUSE. MD ORDERS NO SVN TREATMENT AT THIS TIME

## 2021-10-08 NOTE — Anesthesia Preprocedure Evaluation (Addendum)
Anesthesia Evaluation  Patient identified by MRN, date of birth, ID band Patient awake    Reviewed: Allergy & Precautions, NPO status , Patient's Chart, lab work & pertinent test results  Airway Mallampati: III  TM Distance: >3 FB Neck ROM: full    Dental no notable dental hx.    Pulmonary neg pulmonary ROS,    Pulmonary exam normal        Cardiovascular negative cardio ROS Normal cardiovascular exam     Neuro/Psych negative neurological ROS  negative psych ROS   GI/Hepatic negative GI ROS, Neg liver ROS,   Endo/Other  negative endocrine ROS  Renal/GU negative Renal ROS  negative genitourinary   Musculoskeletal negative musculoskeletal ROS (+)   Abdominal Normal abdominal exam  (+)   Peds  Hematology negative hematology ROS (+)   Anesthesia Other Findings Past Medical History: 10/13/2015: Basal cell carcinoma     Comment:  R nasal tip  No date: Migraines  Past Surgical History: 08/12/2010: arthroscopic knee surgery, right No date: [place]; Left No date: SHOULDER ARTHROSCOPY; Right  BMI    Body Mass Index: 26.61 kg/m      Reproductive/Obstetrics negative OB ROS                          Anesthesia Physical Anesthesia Plan  ASA: 1  Anesthesia Plan: General   Post-op Pain Management:    Induction:   PONV Risk Score and Plan: Propofol infusion and TIVA  Airway Management Planned: Natural Airway and Nasal Cannula  Additional Equipment:   Intra-op Plan:   Post-operative Plan:   Informed Consent: I have reviewed the patients History and Physical, chart, labs and discussed the procedure including the risks, benefits and alternatives for the proposed anesthesia with the patient or authorized representative who has indicated his/her understanding and acceptance.     Dental Advisory Given  Plan Discussed with: Anesthesiologist, CRNA and  Surgeon  Anesthesia Plan Comments:        Anesthesia Quick Evaluation

## 2021-10-08 NOTE — Anesthesia Procedure Notes (Signed)
Procedure Name: MAC Date/Time: 10/08/2021 8:22 AM Performed by: Jerrye Noble, CRNA Pre-anesthesia Checklist: Patient identified, Emergency Drugs available, Suction available and Patient being monitored Patient Re-evaluated:Patient Re-evaluated prior to induction Oxygen Delivery Method: Nasal cannula

## 2021-10-08 NOTE — Transfer of Care (Signed)
Immediate Anesthesia Transfer of Care Note  Patient: Valerie Mcguire  Procedure(s) Performed: COLONOSCOPY WITH PROPOFOL  Patient Location: PACU and Endoscopy Unit  Anesthesia Type:General  Level of Consciousness: awake, drowsy and patient cooperative  Airway & Oxygen Therapy: Patient Spontanous Breathing and Patient connected to face mask oxygen  Post-op Assessment: Report given to RN and Post -op Vital signs reviewed and stable  Post vital signs: Reviewed and stable  Last Vitals:  Vitals Value Taken Time  BP 106/75 10/08/21 0921  Temp 36.1 C 10/08/21 0920  Pulse 62 10/08/21 0921  Resp 21 10/08/21 0921  SpO2 95 % 10/08/21 0921  Vitals shown include unvalidated device data.  Last Pain:  Vitals:   10/08/21 0920  TempSrc: Temporal  PainSc: 0-No pain         Complications: No notable events documented.

## 2021-10-08 NOTE — H&P (Signed)
Cephas Darby, MD [address]  [place]  [place], [place] [address]  Main: [phone]  Fax: [phone] Pager: [phone]  Primary Care Physician:  Remi Haggard, FNP Primary Gastroenterologist:  Dr. Cephas Darby  Pre-Procedure History & Physical: HPI:  Valerie Mcguire is a 66 y.o. female is here for an colonoscopy.   Past Medical History:  Diagnosis Date   Basal cell carcinoma 10/13/2015   R nasal tip    Migraines     Past Surgical History:  Procedure Laterality Date   arthroscopic knee surgery, right  08/12/2010   BELOW KNEE LEG AMPUTATION Left    SHOULDER ARTHROSCOPY Right     Prior to Admission medications   Medication Sig Start Date End Date Taking? Authorizing Provider  b complex vitamins capsule Take 1 capsule by mouth daily.   Yes [provider]  butalbital-acetaminophen-caffeine (FIORICET) 50-325-40 MG tablet Take 1 tablet by mouth every 4 (four) hours as needed. 11/15/19  Yes [provider]  glucosamine-chondroitin 500-400 MG tablet Take 1 tablet by mouth daily.   Yes [provider]  Multiple Vitamin (MULTIVITAMIN) tablet Take 1 tablet by mouth daily.   Yes [provider]  rizatriptan (MAXALT-MLT) 10 MG disintegrating tablet Take 10 mg by mouth 3 (three) times daily as needed. 11/15/19  Yes [provider]    Allergies as of 08/26/2021   (No Known Allergies)    Family History  Problem Relation Age of Onset   Arthritis Mother    Hypertension Mother    Hypertension Father    Heart disease Father    Breast cancer Cousin        maternal side    Social History   Socioeconomic History   Marital status: Single    Spouse name: Not on file   Number of children: Not on file   Years of education: Not on file   Highest education level: Not on file  Occupational History   Not on file  Tobacco Use   Smoking status: Never   Smokeless tobacco: Never  Vaping Use   Vaping Use: Never used   Substance and Sexual Activity   Alcohol use: No   Drug use: No   Sexual activity: Never  Other Topics Concern   Not on file  Social History Narrative   Divorced, single   Exercise: Gym daily.   Diet:Fruits and veggies.   Work as Clinical biochemist.   Social Determinants of Health   Financial Resource Strain: Not on file  Food Insecurity: Not on file  Transportation Needs: Not on file  Physical Activity: Not on file  Stress: Not on file  Social Connections: Not on file  Intimate Partner Violence: Not on file    Review of Systems: See HPI, otherwise negative ROS  Physical Exam: BP 121/80    Pulse 66    Temp (!) 97.3 F (36.3 C) (Temporal)    Resp 20    Ht 5\' 8"  (1.727 m)    Wt 79.4 kg    SpO2 97%    BMI 26.61 kg/m  General:   Alert,  pleasant and cooperative in NAD Head:  Normocephalic and atraumatic. Neck:  Supple; no masses or thyromegaly. Lungs:  Clear throughout to auscultation.    Heart:  Regular rate and rhythm. Abdomen:  Soft, nontender and nondistended. Normal bowel sounds, without guarding, and without rebound.   Neurologic:  Alert and  oriented x4;  grossly normal neurologically.  Impression/Plan: Valerie Mcguire is here for  an colonoscopy to be performed for cologuard positive  Risks, benefits, limitations, and alternatives regarding  colonoscopy have been reviewed with the patient.  Questions have been answered.  All parties agreeable.   Sherri Sear, MD  10/08/2021, 8:17 AM

## 2021-10-08 NOTE — Anesthesia Postprocedure Evaluation (Signed)
Anesthesia Post Note  Patient: Valerie Mcguire  Procedure(s) Performed: COLONOSCOPY WITH PROPOFOL  Patient location during evaluation: PACU Anesthesia Type: General Level of consciousness: awake and alert Pain management: pain level controlled Vital Signs Assessment: post-procedure vital signs reviewed and stable Respiratory status: spontaneous breathing, nonlabored ventilation and respiratory function stable Cardiovascular status: blood pressure returned to baseline and stable Postop Assessment: no apparent nausea or vomiting Anesthetic complications: yes (presumbed bronchospasm) Comments: Pt was observed in pacu with initial wheezes in LLL. Oxygen was quickly weaned to RA. Pt had a cough and sore throat. On exam of oropharynx, there were no obvious injuries but the soft palate had splotchy redness. There was one episode of desaturation to the 70's per the nurse. When I responded, the patient was saturating at 100 on 4L [place]. I removed the oxygen and the patient did not desatuate. Her wheezes were gone and he lungs were CTAB. Pt denied any other problems and VSS. She likely had bronchospasm in the Endo suite or possibly mucous plugging/aspiration of mucous.    No notable events documented.   Last Vitals:  Vitals:   10/08/21 1030 10/08/21 1040  BP: 102/66 108/67  Pulse: 63 60  Resp: 20 (!) 21  Temp:    SpO2: 96% 97%    Last Pain:  Vitals:   10/08/21 1040  TempSrc:   PainSc: 2                  Iran Ouch

## 2021-10-11 LAB — SURGICAL PATHOLOGY

## 2021-10-12 ENCOUNTER — Encounter: Payer: Self-pay | Admitting: Gastroenterology

## 2021-10-12 NOTE — Progress Notes (Signed)
Telephone encounter:  Pt left complaint to Anesthesia Department about hoarse throat. He sore throat resolved but she remains hoarse. She was informed again that she had an episode of either laryngospasm or bronchospasm while under anesthesia that resulted in excessive coughing. Her throat was suctioned. I told her she could have acute vocal strain and swelling of her vocal cords from coughing and that it may take up to 3 weeks to heal. She will allow time to heal and see an ENT physician after this time frame if symptoms persist.   Daron Offer, MD Anesthesia

## 2021-10-12 NOTE — Addendum Note (Signed)
Addendum  created 10/12/21 1602 by Iran Ouch, MD   Clinical Note Signed

## 2021-12-20 IMAGING — MG DIGITAL SCREENING BILAT W/ TOMO W/ CAD
8 series · 8 of 24 positions shown · non-contrast
Comparison: Previous exam(s).

CLINICAL DATA: Screening.

EXAM:
DIGITAL SCREENING BILATERAL MAMMOGRAM WITH TOMO AND CAD

[L CC synth-2D]
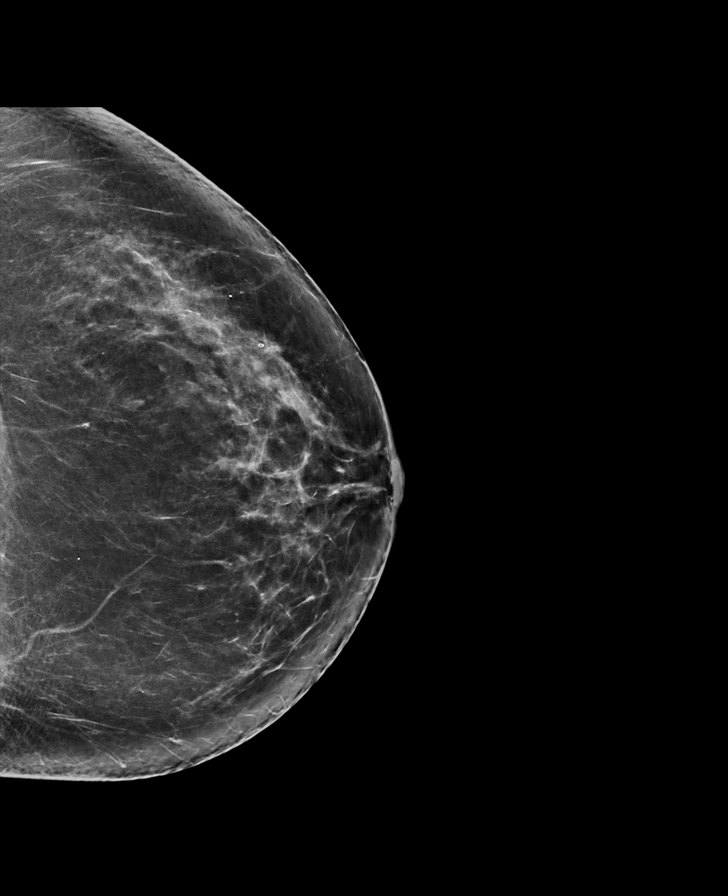

[R CC synth-2D]
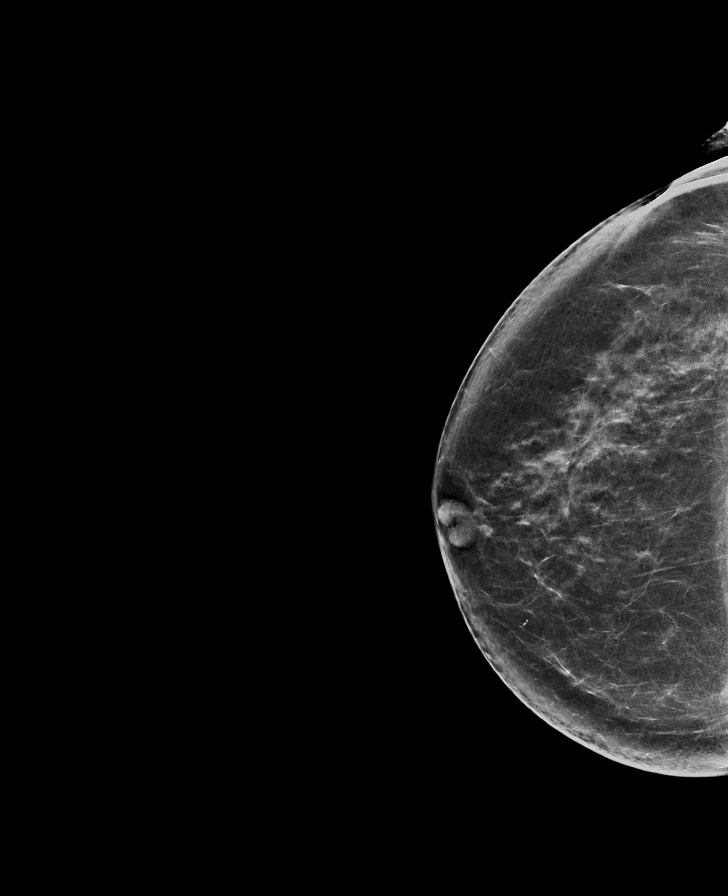

[L MLO synth-2D]
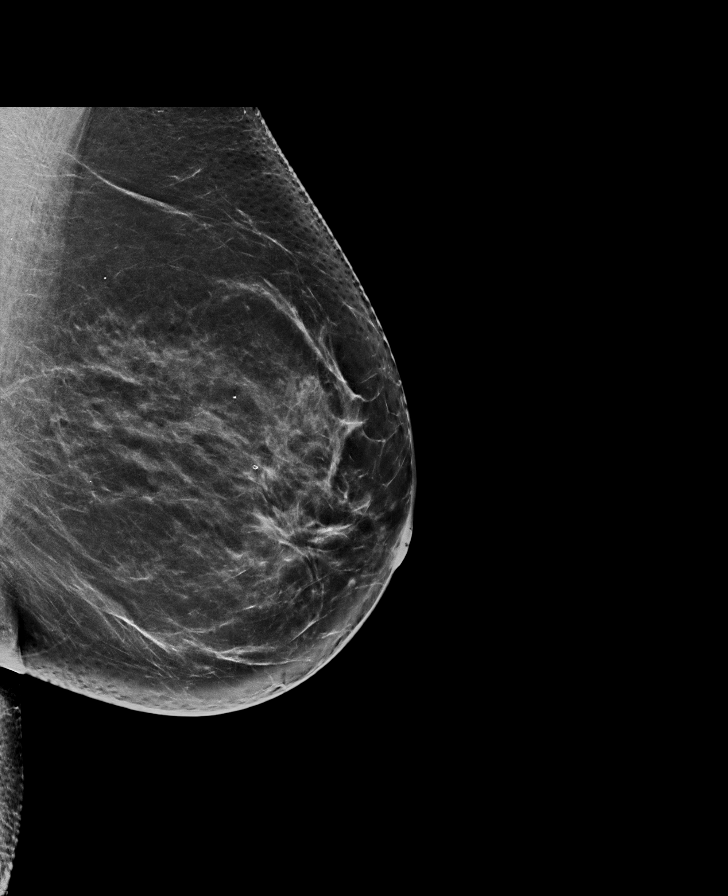

[R MLO synth-2D]
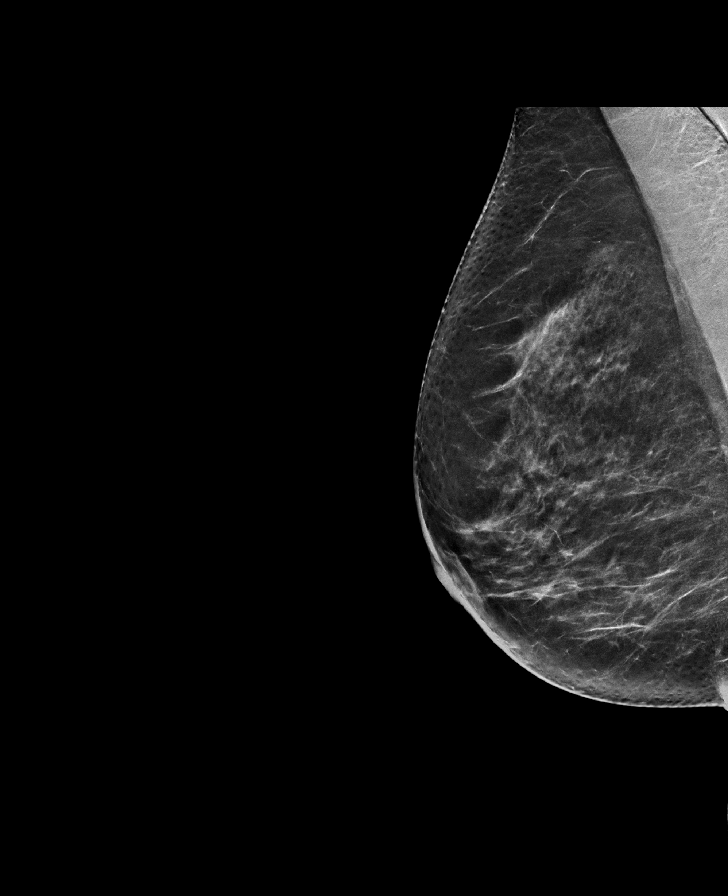

[L CC tomo · tomo slice 41/81.0]
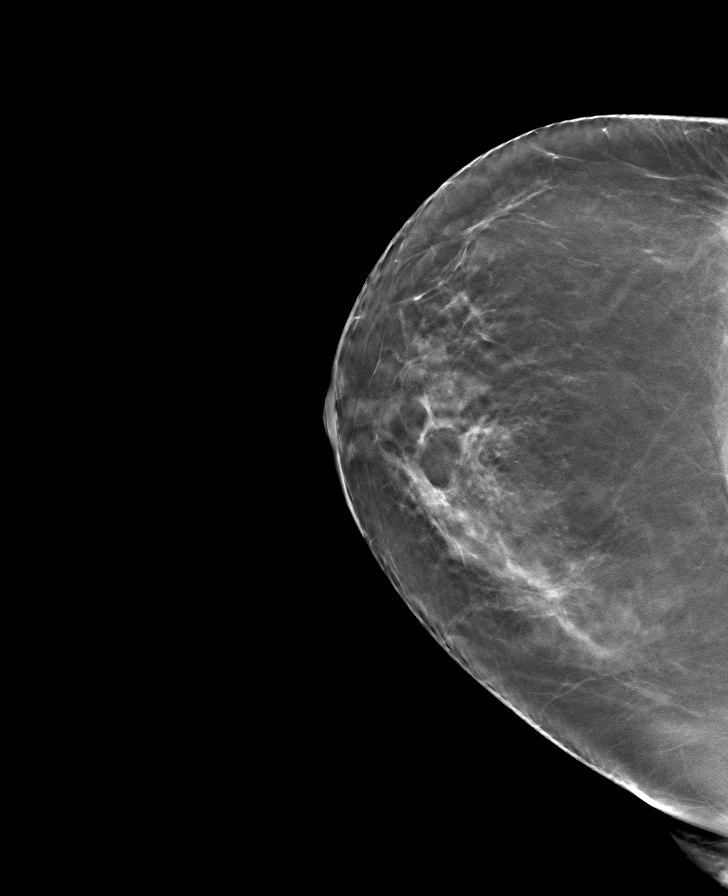

[L MLO tomo · tomo slice 43/86.0]
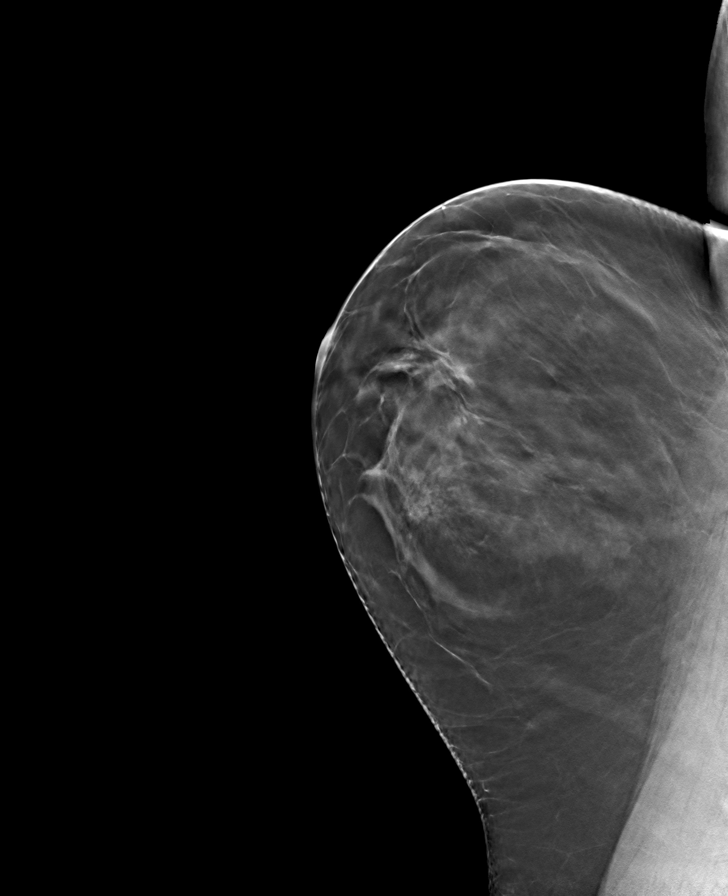

[R MLO tomo · tomo slice 39/76.0]
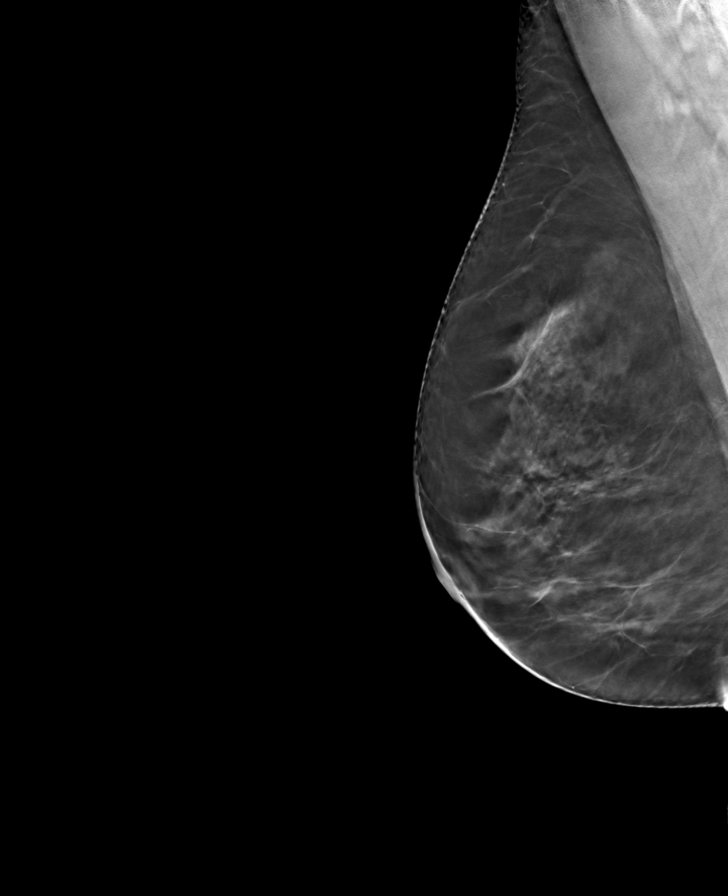

[R CC tomo · tomo slice 41/81.0]
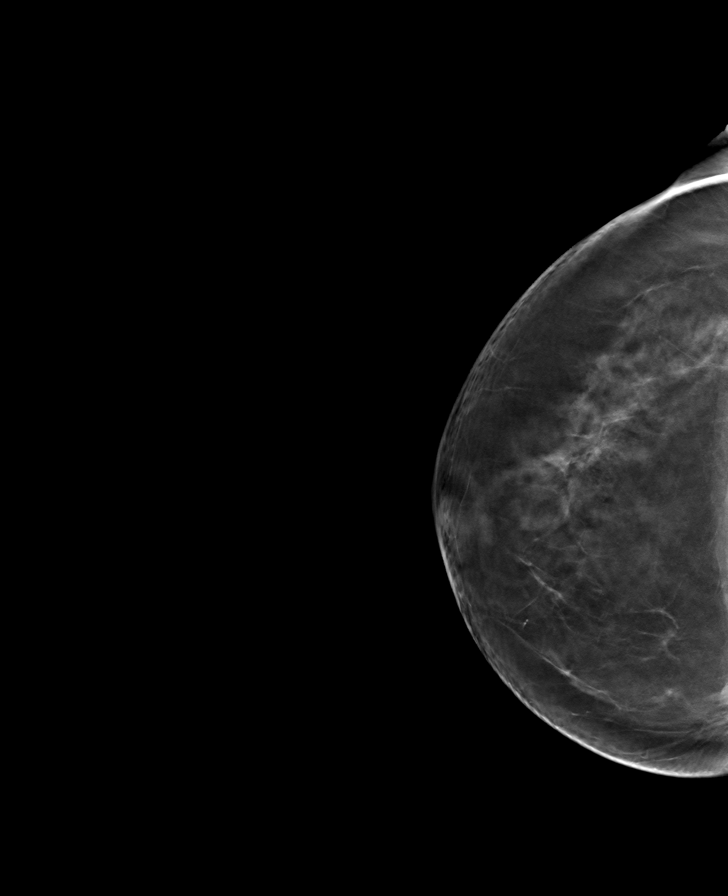

[8 of 24 positions shown; findings below may reference images not displayed]

ACR Breast Density Category b: There are scattered areas of
fibroglandular density.
FINDINGS: There are no findings suspicious for malignancy. Images were
processed with CAD.
IMPRESSION: No mammographic evidence of malignancy. A result letter of this
screening mammogram will be mailed directly to the patient.

RECOMMENDATION:
Screening mammogram in one year. (Code:[id number])

BI-RADS CATEGORY  1: Negative.

## 2022-03-11 ENCOUNTER — Encounter: Payer: Self-pay | Admitting: Family Medicine

## 2022-03-14 ENCOUNTER — Telehealth: Payer: Self-pay

## 2022-03-14 NOTE — Telephone Encounter (Signed)
Pt lmovm requesting a sooner appt than November

## 2022-03-17 ENCOUNTER — Ambulatory Visit (INDEPENDENT_AMBULATORY_CARE_PROVIDER_SITE_OTHER): Payer: Medicare Other | Admitting: Dermatology

## 2022-03-17 DIAGNOSIS — D1800 Hemangioma unspecified site: Secondary | ICD-10-CM

## 2022-03-17 DIAGNOSIS — D3610 Benign neoplasm of peripheral nerves and autonomic nervous system, unspecified: Secondary | ICD-10-CM

## 2022-03-17 DIAGNOSIS — L578 Other skin changes due to chronic exposure to nonionizing radiation: Secondary | ICD-10-CM

## 2022-03-17 DIAGNOSIS — L309 Dermatitis, unspecified: Secondary | ICD-10-CM | POA: Diagnosis not present

## 2022-03-17 DIAGNOSIS — Q825 Congenital non-neoplastic nevus: Secondary | ICD-10-CM

## 2022-03-17 DIAGNOSIS — L814 Other melanin hyperpigmentation: Secondary | ICD-10-CM

## 2022-03-17 DIAGNOSIS — Z1283 Encounter for screening for malignant neoplasm of skin: Secondary | ICD-10-CM

## 2022-03-17 DIAGNOSIS — L720 Epidermal cyst: Secondary | ICD-10-CM | POA: Diagnosis not present

## 2022-03-17 DIAGNOSIS — L821 Other seborrheic keratosis: Secondary | ICD-10-CM

## 2022-03-17 DIAGNOSIS — D229 Melanocytic nevi, unspecified: Secondary | ICD-10-CM

## 2022-03-17 DIAGNOSIS — Z85828 Personal history of other malignant neoplasm of skin: Secondary | ICD-10-CM

## 2022-03-17 DIAGNOSIS — L570 Actinic keratosis: Secondary | ICD-10-CM | POA: Diagnosis not present

## 2022-03-17 MED ORDER — TRIAMCINOLONE ACETONIDE 0.1 % EX CREA: TOPICAL_CREAM | CUTANEOUS | 0 refills | Status: DC

## 2022-03-17 NOTE — Telephone Encounter (Signed)
Do not have any appointments sooner. Will put patient on a wait list if anything comes up sooner we will call

## 2022-03-17 NOTE — Progress Notes (Signed)
Follow-Up Visit   Subjective  Valerie Mcguire is a 66 y.o. female who presents for the following: Annual Exam (Itchy spot at posterior left shoulder and brown spots at forehead. H/o of bcc at right nasal tip 2017, hx of aks. ).  Itchy area on back has been there couple months or so.  The patient presents for Total-Body Skin Exam (TBSE) for skin cancer screening and mole check.  The patient has spots, moles and lesions to be evaluated, some may be new or changing and the patient has concerns that these could be cancer.    The following portions of the chart were reviewed this encounter and updated as appropriate:      Review of Systems: No other skin or systemic complaints except as noted in HPI or Assessment and Plan.   Objective  Well appearing patient in no apparent distress; mood and affect are within normal limits.  A full examination was performed including scalp, head, eyes, ears, nose, lips, neck, chest, axillae, abdomen, back, buttocks, bilateral upper extremities, bilateral lower extremities, hands, feet, fingers, toes, fingernails, and toenails. All findings within normal limits unless otherwise noted below.  Left spinal upper Back Pink scaly patch  right upper forehead x 1, right central upper lip at vermillion x 1 (2) Erythematous thin papules/macules with gritty scale.   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 Condition is bothersome/symptomatic for patient. Currently flared.   Recommend mild soap and moisturizing cream 1-2 times daily.  Gentle skin care handout provided.   Start TMC 0.1 % cream - apply topically 1-2 times qd prn for itch at left upper back. Avoid applying to face, groin, and axilla. Use as  directed. Long-term use can cause thinning of the skin.  Topical steroids (such as triamcinolone, fluocinolone, fluocinonide, mometasone, clobetasol, halobetasol, betamethasone, hydrocortisone) can cause thinning and lightening of the skin if they are used for too long in the same area. Your physician has selected the right strength medicine for your problem and area affected on the body. Please use your medication only as directed by your physician to prevent side effects.    triamcinolone cream (KENALOG) 0.1 % - Left spinal upper Back Apply topically 1 - 2 times qd as needed for itch at left upper back. Then just use as needed. Avoid applying to face, groin, and axilla. Use as directed.  Actinic keratosis (2) right upper forehead x 1, right central upper lip at vermillion x 1  Actinic keratoses are precancerous spots that appear secondary to cumulative UV radiation exposure/sun exposure over time. They are chronic with expected duration over 1 year. A portion of actinic keratoses will progress to squamous cell carcinoma of the skin. It is not possible to reliably predict which spots will progress to skin cancer and so treatment is recommended to prevent development of skin cancer.  Recommend daily broad spectrum sunscreen SPF 30+ to sun-exposed areas, reapply every 2 hours as needed.  Recommend staying in the shade or wearing long sleeves, sun glasses (UVA+UVB protection) and wide brim hats (4-inch brim around the entire circumference of the hat). Call for new or changing lesions.  Destruction of lesion - right upper forehead x 1, right central upper lip at vermillion x 1  Destruction method: cryotherapy   Informed consent: discussed and consent obtained   Lesion destroyed using liquid nitrogen: Yes   Region frozen until ice ball extended beyond lesion: Yes   Outcome: patient tolerated procedure well with no complications   Post-procedure details: wound care instructions given   Additional  details:  Prior to procedure, discussed risks of blister formation, small wound, skin dyspigmentation, or rare scar following cryotherapy. Recommend Vaseline ointment to treated areas while healing.   Epidermal cyst Left Antecubitum  Milia  Benign. Observation.     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 Observe   Lentigines - Scattered tan macules - Due to sun exposure - Benign-appearing, observe - Recommend daily broad spectrum sunscreen SPF 30+ to sun-exposed areas, reapply every 2 hours as needed. - Call for any changes  Seborrheic Keratoses At right lateral thigh 5 mm waxy brown papule  - Stuck-on, waxy, tan-brown papules and/or plaques  - Benign-appearing - Discussed benign etiology and prognosis. - Observe - Call for any changes  Melanocytic Nevi - Tan-brown and/or pink-flesh-colored symmetric macules and papules - Benign appearing on exam today - Observation - Call clinic for new or changing moles - Recommend daily use of broad spectrum spf 30+ sunscreen to sun-exposed areas.   Hemangiomas - Red papules - Discussed benign nature - Observe - Call for any changes  Actinic Damage - Severe, confluent actinic changes with pre-cancerous actinic keratoses face - Severe, chronic, not at goal, secondary to cumulative UV radiation exposure over time - diffuse scaly erythematous macules and papules with underlying dyspigmentation - Discussed Prescription "Field Treatment" for Severe, Chronic Confluent Actinic Changes with Pre-Cancerous Actinic Keratoses Field treatment involves treatment of an entire area of skin that has confluent Actinic Changes (Sun/ Ultraviolet light damage) and PreCancerous Actinic Keratoses by method of PhotoDynamic Therapy (PDT) and/or prescription Topical Chemotherapy agents such as 5-fluorouracil, 5-fluorouracil/calcipotriene, and/or imiquimod.  The purpose is to  decrease the number of clinically evident and subclinical PreCancerous lesions to prevent progression to development of skin cancer by chemically destroying early precancer changes that may or may not be visible.  It has been shown to reduce the risk of developing skin cancer in the treated area. As a result of treatment, redness, scaling, crusting, and open sores may occur during treatment course. One or more than one of these methods may be used and may have to be used several times to control, suppress and eliminate the PreCancerous changes. Discussed treatment course, expected reaction, and possible side effects. - Recommend daily broad spectrum sunscreen SPF 30+ to sun-exposed areas, reapply every 2 hours as needed.  - Staying in the shade or wearing long sleeves, sun glasses (UVA+UVB protection) and wide brim hats (4-inch brim around the entire circumference of the hat) are also recommended. - Call for new or changing lesions. - Will schedule photodynamic therapy to the face x 2, 1 mo apart   History of Basal Cell Carcinoma of the Skin at right nasal tip 2017 - No evidence of recurrence today - Recommend regular full body skin exams - Recommend daily broad spectrum sunscreen SPF 30+ to sun-exposed areas, reapply every 2 hours as needed.  - Call if any new or changing lesions are noted between office visits  Skin cancer screening performed today. Return in about 1 year (around 03/18/2023) for TBSE, schedule pdt treatment for face . I, Ruthell Rummage, CMA, am acting as scribe for [employer],  MD.  Documentation: I have reviewed the above documentation for accuracy and completeness, and I agree with the above.  Brendolyn Patty MD

## 2022-03-17 NOTE — Patient Instructions (Addendum)
Photodynamic Therapy/Blue Light Therapy  Actinic keratoses are the dry, red scaly spots on the skin caused by sun damage. A portion of these spots can turn into skin cancer with time, and treating them can help prevent development of skin cancer.   Treatment of these spots requires removal of the defective skin cells. There are various ways to remove actinic keratoses, including freezing with liquid nitrogen, treatment with creams, or treatment with a blue light procedure in the office.   Photodynamic Therapy (PDT), also known as "blue light therapy" is an in office procedure used to treat actinic keratoses. It works by targeting precancerous cells. After treatment, these cells peel off and are replaced by healthy ones.   For your phototherapy appointment, you will have two appointments on the day of your treatment. The first appointment will be to apply a cream to the treatment area. You will leave this cream on for 1-2 hours depending on the area being treated. The second appointment will be to shine a blue light on the area for 16 minutes to kill off the precancer cells. It is common to experience a burning sensation during the treatment.  After your treatment, it will be important to keep the treated areas of skin out of the sun completely for 48-72 hours (2-3 days) to prevent having a reaction.   Common side effects include: - Burning or stinging, which may be severe and can last up to 24-72 hours after your treatment - Scaling and crusting which may last up to 2 weeks - Redness, swelling and/or peeling which can last up to 4 weeks  To Care for Your Skin After PDT/Blue Light Therapy: - Wash with soap, water and shampoo as normal. - If needed, you can use cold compresses (e.g. ice packs) for comfort - If okay with your primary care doctor, you may use analgesics such as acetaminophen (tylenol) every 4-6 hours, not to exceed recommended dose - You may apply Cerave Healing Ointment, Vaseline  or Aquaphor as needed - If you have a lot of swelling you may take a Benadryl to help with this (this may cause drowsiness), not to exceed recommended dose. This may increase the risk of falls in people over 65 and may slow reaction time while driving, so it is not recommended to take before driving or operating machinery. - Sun Precautions - Wear a wide brim hat for the next week if outside  - Wear a sunblock with zinc or titanium dioxide at least SPF 50 daily  If you have any questions or concerns, please call the office and ask to speak with a nurse.   --------------------------------------------------------------------------------------------------------------       Recommend Valerie Mcguire at [facility] for laser hair removal    Topical steroids (such as triamcinolone, fluocinolone, fluocinonide, mometasone, clobetasol, halobetasol, betamethasone, hydrocortisone) can cause thinning and lightening of the skin if they are used for too long in the same area. Your physician has selected the right strength medicine for your problem and area affected on the body. Please use your medication only as directed by your physician to prevent side effects.    Actinic keratoses are precancerous spots that appear secondary to cumulative UV radiation exposure/sun exposure over time. They are chronic with expected duration over 1 year. A portion of actinic keratoses will progress to squamous cell carcinoma of the skin. It is not possible to reliably predict which spots will progress to skin cancer and so treatment is recommended to prevent development of skin  cancer.  Recommend daily broad spectrum sunscreen SPF 30+ to sun-exposed areas, reapply every 2 hours as needed.  Recommend staying in the shade or wearing long sleeves, sun glasses (UVA+UVB protection) and wide brim hats (4-inch brim around the entire circumference of the hat). Call for new or changing lesions.    Cryotherapy  Aftercare  Wash gently with soap and water everyday.   Apply Vaseline and Band-Aid daily until healed.    Melanoma ABCDEs  Melanoma is the most dangerous type of skin cancer, and is the leading cause of death from skin disease.  You are more likely to develop melanoma if you: Have light-colored skin, light-colored eyes, or red or blond hair Spend a lot of time in the sun Tan regularly, either outdoors or in a tanning bed Have had blistering sunburns, especially during childhood Have a close family member who has had a melanoma Have atypical moles or large birthmarks  Early detection of melanoma is key since treatment is typically straightforward and cure rates are extremely high if we catch it early.   The first sign of melanoma is often a change in a mole or a new dark spot.  The ABCDE system is a way of remembering the signs of melanoma.  A for asymmetry:  The two halves do not match. B for border:  The edges of the growth are irregular. C for color:  A mixture of colors are present instead of an even brown color. D for diameter:  Melanomas are usually (but not always) greater than 1m - the size of a pencil eraser. E for evolution:  The spot keeps changing in size, shape, and color.  Please check your skin once per month between visits. You can use a small mirror in front and a large mirror behind you to keep an eye on the back side or your body.   If you see any new or changing lesions before your next follow-up, please call to schedule a visit.  Please continue daily skin protection including broad spectrum sunscreen SPF 30+ to sun-exposed areas, reapplying every 2 hours as needed when you're outdoors.   Staying in the shade or wearing long sleeves, sun glasses (UVA+UVB protection) and wide brim hats (4-inch brim around the entire circumference of the hat) are also recommended for sun protection.    Due to recent changes in healthcare laws, you may see results of your pathology  and/or laboratory studies on MyChart before the doctors have had a chance to review them. We understand that in some cases there may be results that are confusing or concerning to you. Please understand that not all results are received at the same time and often the doctors may need to interpret multiple results in order to provide you with the best plan of care or course of treatment. Therefore, we ask that you please give u[place]2 business days to thoroughly review all your results before contacting the office for clarification. Should we see a critical lab result, you will be contacted sooner.   If You Need Anything After Your Visit  If you have any questions or concerns for your doctor, please call our main line at [phone] press option 4 to reach your doctor's medical assistant. If no one answers, please leave a voicemail as directed and we will return your call as soon as possible. Messages left after 4 pm will be answered the following business day.   You may also send [place] message via [place] We  typically respond to MyChart messages within 1-2 business days.  For prescription refills, please ask your pharmacy to contact our office. Our fax number is [phone].  If you have an urgent issue when the clinic is closed that cannot wait until the next business day, you can page your doctor at the number below.    Please note that while we do our best to be available for urgent issues outside of office hours, we are not available 24/7.   If you have an urgent issue and are unable to reach [place], you may choose to seek medical care at your doctor's office, retail clinic, urgent care center, or emergency room.  If you have a medical emergency, please immediately call 911 or go to the emergency department.  Pager Numbers  - Dr. Nehemiah Massed: [phone]  - Dr. Laurence Ferrari: [phone]  - Dr. Nicole Kindred: [phone]  In the event of inclement weather, please call our main line at [phone] for  an update on the status of any delays or closures.  Dermatology Medication Tips: Please keep the boxes that topical medications come in in order to help keep track of the instructions about where and how to use these. Pharmacies typically print the medication instructions only on the boxes and not directly on the medication tubes.   If your medication is too expensive, please contact our office at [phone] option 4 or send [place] a message through [place].   We are unable to tell what your co-pay for medications will be in advance as this is different depending on your insurance coverage. However, we may be able to find a substitute medication at lower cost or fill out paperwork to get insurance to cover a needed medication.   If a prior authorization is required to get your medication covered by your insurance company, please allow [place] 1-2 business days to complete this process.  Drug prices often vary depending on where the prescription is filled and some pharmacies may offer cheaper prices.  The website www.goodrx.com contains coupons for medications through different pharmacies. The prices here do not account for what the cost may be with help from insurance (it may be cheaper with your insurance), but the website can give you the Mcguire if you did not use any insurance.  - You can print the associated coupon and take it with your prescription to the pharmacy.  - You may also stop by our office during regular business hours and pick up a GoodRx coupon card.  - If you need your prescription sent electronically to a different pharmacy, notify our office through [facility] or by phone at [phone] option 4.     Si Usted Necesita Algo Despus de Su Visita  Tambin puede enviarnos un mensaje a travs de Pharmacist, community. Por lo general respondemos a los mensajes de MyChart en el transcurso de 1 a 2 das hbiles.  Para renovar recetas, por favor pida a su farmacia que se ponga en contacto con  nuestra oficina. Harland Dingwall de fax es [place] [phone].  Si tiene un asunto urgente cuando la clnica est cerrada y que no puede esperar hasta el siguiente da hbil, puede llamar/localizar a su doctor(a) al nmero que aparece a continuacin.   Por favor, tenga en cuenta que aunque hacemos todo lo posible para estar disponibles para asuntos urgentes fuera del horario de [place], no estamos disponibles las 24 horas del da, los 7 das de la [place].   Si tiene un problema urgente y no puede comunicarse  con nosotros, puede optar por buscar atencin mdica  en el consultorio de su doctor(a), en una clnica privada, en un centro de atencin urgente o en una sala de emergencias.  Si tiene Engineering geologist, por favor llame inmediatamente al 911 o vaya a la sala de emergencias.  Nmeros de bper  - Dr. Nehemiah Massed: [phone]  - Dra. Moye: [phone]  - Dra. Nicole Kindred: [phone]  En caso de inclemencias del [place], por favor llame a Johnsie Kindred principal al [phone] para una actualizacin sobre el [place] de cualquier retraso o cierre.  Consejos para la medicacin en dermatologa: Por favor, guarde las cajas en las que vienen los medicamentos de uso tpico para ayudarle a seguir las instrucciones sobre dnde y cmo usarlos. Las farmacias generalmente imprimen las instrucciones del medicamento slo en las cajas y no directamente en los tubos del [place].   Si su medicamento es muy caro, por favor, pngase en contacto con Zigmund Daniel llamando al [phone] y presione la opcin 4 o envenos un mensaje a travs de Pharmacist, community.   No podemos decirle cul ser su copago por los medicamentos por adelantado ya que esto es diferente dependiendo de la cobertura de su seguro. Sin embargo, es posible que podamos encontrar un medicamento sustituto a Electrical engineer un formulario para que el seguro cubra el medicamento que se considera necesario.   Si se requiere una autorizacin  previa para que su compaa de seguros [place] su medicamento, por favor permtanos de 1 a 2 das hbiles para completar este proceso.  Los precios de los medicamentos varan con frecuencia dependiendo del Environmental consultant de dnde se surte la receta y alguna farmacias pueden ofrecer precios ms baratos.  El sitio web www.goodrx.com tiene cupones para medicamentos de Airline pilot. Los precios aqu no tienen en cuenta lo que podra costar con la ayuda del seguro (puede ser ms barato con su seguro), pero el sitio web puede darle el precio si no utiliz Research scientist (physical sciences).  - Puede imprimir el cupn correspondiente y llevarlo con su receta a la farmacia.  - Tambin puede pasar por nuestra oficina durante el horario de atencin regular y Charity fundraiser una tarjeta de cupones de GoodRx.  - Si necesita que su receta se enve electrnicamente a una farmacia diferente, informe a nuestra oficina a travs de MyChart de [place] o por telfono llamando al [phone] y presione la opcin 4.

## 2022-03-29 ENCOUNTER — Encounter: Payer: Medicare Other | Admitting: Dermatology

## 2022-07-26 ENCOUNTER — Ambulatory Visit: Payer: Medicare Other | Admitting: Gastroenterology

## 2022-07-27 ENCOUNTER — Ambulatory Visit: Payer: Medicare Other | Admitting: Gastroenterology

## 2023-03-21 ENCOUNTER — Ambulatory Visit (INDEPENDENT_AMBULATORY_CARE_PROVIDER_SITE_OTHER): Payer: Medicare Other | Admitting: Dermatology

## 2023-03-21 DIAGNOSIS — D1801 Hemangioma of skin and subcutaneous tissue: Secondary | ICD-10-CM

## 2023-03-21 DIAGNOSIS — Z872 Personal history of diseases of the skin and subcutaneous tissue: Secondary | ICD-10-CM

## 2023-03-21 DIAGNOSIS — L578 Other skin changes due to chronic exposure to nonionizing radiation: Secondary | ICD-10-CM

## 2023-03-21 DIAGNOSIS — L820 Inflamed seborrheic keratosis: Secondary | ICD-10-CM

## 2023-03-21 DIAGNOSIS — W908XXA Exposure to other nonionizing radiation, initial encounter: Secondary | ICD-10-CM

## 2023-03-21 DIAGNOSIS — Q825 Congenital non-neoplastic nevus: Secondary | ICD-10-CM

## 2023-03-21 DIAGNOSIS — R238 Other skin changes: Secondary | ICD-10-CM

## 2023-03-21 DIAGNOSIS — L814 Other melanin hyperpigmentation: Secondary | ICD-10-CM

## 2023-03-21 DIAGNOSIS — D229 Melanocytic nevi, unspecified: Secondary | ICD-10-CM

## 2023-03-21 DIAGNOSIS — Z1283 Encounter for screening for malignant neoplasm of skin: Secondary | ICD-10-CM | POA: Diagnosis not present

## 2023-03-21 DIAGNOSIS — Z85828 Personal history of other malignant neoplasm of skin: Secondary | ICD-10-CM

## 2023-03-21 DIAGNOSIS — L821 Other seborrheic keratosis: Secondary | ICD-10-CM

## 2023-03-21 NOTE — Progress Notes (Signed)
Follow-Up Visit   Subjective  Valerie Mcguire is a 67 y.o. female who presents for the following: Skin Cancer Screening and Full Body Skin Exam  The patient presents for Total-Body Skin Exam (TBSE) for skin cancer screening and mole check. The patient has spots, moles and lesions to be evaluated, some may be new or changing and the patient has concerns that these could be cancer.  Hx of BCC at right nasal tip 2017, Hx of AK. Patient does have a dry itchy spot at back.   The following portions of the chart were reviewed this encounter and updated as appropriate: medications, allergies, medical history  Review of Systems:  No other skin or systemic complaints except as noted in HPI or Assessment and Plan.  Objective  Well appearing patient in no apparent distress; mood and affect are within normal limits.  A full examination was performed including scalp, head, eyes, ears, nose, lips, neck, chest, axillae, abdomen, back, buttocks, bilateral upper extremities, bilateral lower extremities, hands, feet, fingers, toes, fingernails, and toenails. All findings within normal limits unless otherwise noted below.   Relevant physical exam findings are noted in the Assessment and Plan.  Left Spinal Mid Upper Back Erythematous stuck-on, waxy papule or plaque    Assessment & Plan   SKIN CANCER SCREENING PERFORMED TODAY.  ACTINIC DAMAGE - Chronic condition, secondary to cumulative UV/sun exposure - diffuse scaly erythematous macules with underlying dyspigmentation - Recommend daily broad spectrum sunscreen SPF 30+ to sun-exposed areas, reapply every 2 hours as needed.  - Staying in the shade or wearing long sleeves, sun glasses (UVA+UVB protection) and wide brim hats (4-inch brim around the entire circumference of the hat) are also recommended for sun protection.  - Call for new or changing lesions.  LENTIGINES, SEBORRHEIC KERATOSES, HEMANGIOMAS - Benign normal skin lesions -  Benign-appearing - Call for any changes  MELANOCYTIC NEVI - Tan-brown and/or pink-flesh-colored symmetric macules and papules - Benign appearing on exam today - Observation - Call clinic for new or changing moles - Recommend daily use of broad spectrum spf 30+ sunscreen to sun-exposed areas.   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 Observe   MELANOCYTIC NEVUS vs SK Exam:  5 mm medium dark brown papule at right lateral upper thigh       Treatment Plan: Benign-appearing. Stable compared to previous visit. Observation.  Call clinic for new or changing moles.  Recommend daily use of broad spectrum spf 30+ sunscreen to sun-exposed areas.     HISTORY OF BASAL CELL CARCINOMA OF THE SKIN - No evidence of recurrence today at right nasal tip - Recommend regular full body skin exams - Recommend daily broad spectrum sunscreen SPF 30+ to sun-exposed areas, reapply every 2 hours as needed.  - Call if any new or changing lesions are noted between office visits  HISTORY OF PRECANCEROUS ACTINIC KERATOSIS - site(s) of PreCancerous Actinic Keratosis clear today. - these may recur and new lesions may form requiring treatment to prevent transformation into skin cancer - observe for new or changing spots and contact [facility] for appointment if occur - photoprotection with sun protective clothing; sunglasses and broad spectrum sunscreen with SPF of at least 30 + and frequent self skin exams recommended - yearly exams by a dermatologist recommended for persons with history of PreCancerous Actinic Keratoses   Inflamed  seborrheic keratosis Left Spinal Mid Upper Back  Symptomatic, irritating, patient would like treated.  Benign-appearing.  Call clinic for new or changing lesions.    Destruction of lesion - Left Spinal Mid Upper Back  Destruction  method: cryotherapy   Informed consent: discussed and consent obtained   Lesion destroyed using liquid nitrogen: Yes   Region frozen until ice ball extended beyond lesion: Yes   Outcome: patient tolerated procedure well with no complications   Post-procedure details: wound care instructions given   Additional details:  Prior to procedure, discussed risks of blister formation, small wound, skin dyspigmentation, or rare scar following cryotherapy. Recommend Vaseline ointment to treated areas while healing.    Return in about 1 year (around 03/20/2024) for TBSE, Hx BCC.  Anise Salvo, RMA, am acting as scribe for Willeen Niece, MD .   Documentation: I have reviewed the above documentation for accuracy and completeness, and I agree with the above.  Willeen Niece, MD

## 2023-08-15 ENCOUNTER — Other Ambulatory Visit: Payer: Self-pay | Admitting: Family Medicine

## 2023-08-15 DIAGNOSIS — N951 Menopausal and female climacteric states: Secondary | ICD-10-CM

## 2023-08-15 DIAGNOSIS — N938 Other specified abnormal uterine and vaginal bleeding: Secondary | ICD-10-CM

## 2023-08-16 ENCOUNTER — Telehealth: Payer: Self-pay

## 2023-08-16 NOTE — Telephone Encounter (Signed)
TRIAGE VOICEMAIL: Patient states she is supposed to be scheduled for a gynocologist. They scheduled her Valerie Mcguire and she wanted Valerie Mcguire because that where she lives.

## 2023-08-17 ENCOUNTER — Other Ambulatory Visit: Payer: Medicare Other

## 2023-08-23 ENCOUNTER — Ambulatory Visit
Admission: RE | Admit: 2023-08-23 | Discharge: 2023-08-23 | Disposition: A | Discharge status: Home / Self Care | Payer: Medicare Other | Source: Ambulatory Visit | Attending: Family Medicine | Admitting: Family Medicine

## 2023-08-23 ENCOUNTER — Other Ambulatory Visit: Payer: Self-pay | Admitting: Family Medicine

## 2023-08-23 DIAGNOSIS — N951 Menopausal and female climacteric states: Secondary | ICD-10-CM

## 2023-08-23 DIAGNOSIS — N938 Other specified abnormal uterine and vaginal bleeding: Secondary | ICD-10-CM

## 2023-08-28 ENCOUNTER — Encounter: Payer: Self-pay | Admitting: Certified Nurse Midwife

## 2023-08-28 ENCOUNTER — Ambulatory Visit: Payer: Medicare Other | Admitting: Certified Nurse Midwife

## 2023-08-28 VITALS — BP 123/80 | HR 65 | Wt 187.4 lb

## 2023-08-28 DIAGNOSIS — N950 Postmenopausal bleeding: Secondary | ICD-10-CM | POA: Diagnosis not present

## 2023-08-28 NOTE — Progress Notes (Signed)
    GYNECOLOGY PROGRESS NOTE  Subjective:    Patient ID: Valerie Mcguire, female    DOB: 08-Nov-1955, 67 y.o.   MRN: [id number]  HPI  Patient is a 67 y.o. No obstetric history on file. female who presents for postmenopausal bleeding. She was receiving estrogen replacement therapy for hot flashes and had episode of bleeding. Her PCP ordered u/s   The following portions of the patient's history were reviewed and updated as appropriate: allergies, current medications, past family history, past medical history, past social history, past surgical history, and problem list.  Review of Systems Pertinent items are noted in HPI.   Objective:   There were no vitals taken for this visit. There is no height or weight on file to calculate BMI. General appearance: alert, cooperative, appears stated age, and no distress Abdomen: soft, non-tender; bowel sounds normal; no masses,  no organomegaly Pelvic: Pt declines exam, states bleeding has stopped. She denies any other symptoms.  Extremities: extremities normal, atraumatic, no cyanosis or edema Neurologic: Alert and oriented X 3, normal strength and tone. Normal symmetric reflexes. Normal coordination and gait  Narrative & Impression  : PROCEDURE: US PELVIS COMPLETE   HISTORY: Patient is a 67 y/o F with OTHER SPEC ABNORMAL UTERINE AND VAGINAL BLEEDING, MENOPAUSAL OR FEMALE CLIMACTERIC STATES.   COMPARISON: None.   TECHNIQUE: Two-dimensional transabdominal grayscale and color Doppler ultrasound of the pelvis was performed.   FINDINGS: The uterus is anteverted in position and measures 7.9 x 4.6 x 5.4 cm. It demonstrates a heterogeneous echotexture. The endometrium measures 0.5 cm and demonstrates a normal homogeneous echotexture.   The right ovary measures 3.8 x 2.6 x 3.4 cm and demonstrates a 2.6 x 2.4 x 2.6 cm complex cyst.   The left ovary is not visualized.   There is no fluid present within the cul-de-sac.   IMPRESSION: 1.  Heterogeneous uterine echotexture.  No definite fibroid.   2. Top-normal postmenopausal endometrial thickness. This is not well visualized due to overlying bowel gas. The patient declined transvaginal ultrasound examination.   3. Right ovarian 2.6 cm complex cyst. Again, not well visualized due to overlying bowel gas. Transvaginal ultrasound is recommended for further evaluation.   Thank you for allowing [place] to assist in the care of this patient.     Electronically Signed   By: Lestine Box M.D.   On: 08/24/2023 07:30     Assessment:   Postmenopausal bleeding Hormone replacement therapy  Plan:   Pt states the bleeding occurred due to HRT. States that it has stopped. Discussed that this is highly likely . Her U/s showed endometrial thickness approximately 5 mm. Discussed results with pt and that this is considered a normal finding. Pt given option for endometrial biopsy and declines. States she will follow up if she has this again.    Face to face time 15 min , reviewing chart, discussing history, discussing plan of care with pt.   Doreene Burke, CNM

## 2024-04-02 ENCOUNTER — Encounter: Payer: Self-pay | Admitting: Dermatology

## 2024-04-02 ENCOUNTER — Ambulatory Visit: Payer: Medicare Other | Admitting: Dermatology

## 2024-04-02 DIAGNOSIS — R238 Other skin changes: Secondary | ICD-10-CM

## 2024-04-02 DIAGNOSIS — Z1283 Encounter for screening for malignant neoplasm of skin: Secondary | ICD-10-CM | POA: Diagnosis not present

## 2024-04-02 DIAGNOSIS — L820 Inflamed seborrheic keratosis: Secondary | ICD-10-CM

## 2024-04-02 DIAGNOSIS — L578 Other skin changes due to chronic exposure to nonionizing radiation: Secondary | ICD-10-CM | POA: Diagnosis not present

## 2024-04-02 DIAGNOSIS — L918 Other hypertrophic disorders of the skin: Secondary | ICD-10-CM

## 2024-04-02 DIAGNOSIS — L821 Other seborrheic keratosis: Secondary | ICD-10-CM

## 2024-04-02 DIAGNOSIS — Z85828 Personal history of other malignant neoplasm of skin: Secondary | ICD-10-CM

## 2024-04-02 DIAGNOSIS — W908XXA Exposure to other nonionizing radiation, initial encounter: Secondary | ICD-10-CM

## 2024-04-02 DIAGNOSIS — D1801 Hemangioma of skin and subcutaneous tissue: Secondary | ICD-10-CM

## 2024-04-02 DIAGNOSIS — L814 Other melanin hyperpigmentation: Secondary | ICD-10-CM | POA: Diagnosis not present

## 2024-04-02 DIAGNOSIS — L570 Actinic keratosis: Secondary | ICD-10-CM

## 2024-04-02 DIAGNOSIS — D229 Melanocytic nevi, unspecified: Secondary | ICD-10-CM

## 2024-04-02 NOTE — Progress Notes (Signed)
 Follow-Up Visit   Subjective  Valerie Mcguire is a 68 y.o. female who presents for the following: Skin Cancer Screening and Full Body Skin Exam  The patient presents for Total-Body Skin Exam (TBSE) for skin cancer screening and mole check. The patient has spots, moles and lesions to be evaluated, some may be new or changing. She has a growth at the left inguinal area x 2 months, also dry spot at left eyebrow, and tag at the left flank at bra line.    The following portions of the chart were reviewed this encounter and updated as appropriate: medications, allergies, medical history  Review of Systems:  No other skin or systemic complaints except as noted in HPI or Assessment and Plan.  Objective  Well appearing patient in no apparent distress; mood and affect are within normal limits.  A full examination was performed including scalp, head, eyes, ears, nose, lips, neck, chest, axillae, abdomen, back, buttocks, bilateral upper extremities, bilateral lower extremities, hands, feet, fingers, toes, fingernails, and toenails. All findings within normal limits unless otherwise noted below.   Relevant physical exam findings are noted in the Assessment and Plan.  Left Eyebrow x 2 Pink scaly macules.  R lat thigh 5 mm stuck-on, scaly medium dark brown papule, Benign features under dermoscopy.  Left Inguinal Area Erythematous stuck-on, waxy papule  Assessment & Plan   SKIN CANCER SCREENING PERFORMED TODAY.  ACTINIC DAMAGE WITH PRECANCEROUS ACTINIC KERATOSES Counseling for Topical Chemotherapy Management: Patient exhibits: - Severe, confluent actinic changes with pre-cancerous actinic keratoses that is secondary to cumulative UV radiation exposure over time - Condition that is severe; chronic, not at goal. - diffuse scaly erythematous macules and papules with underlying dyspigmentation - Discussed Prescription Field Treatment topical Chemotherapy for Severe, Chronic Confluent Actinic  Changes with Pre-Cancerous Actinic Keratoses Field treatment involves treatment of an entire area of skin that has confluent Actinic Changes (Sun/ Ultraviolet light damage) and PreCancerous Actinic Keratoses by method of PhotoDynamic Therapy (PDT) and/or prescription Topical Chemotherapy agents such as 5-fluorouracil, 5-fluorouracil/calcipotriene, and/or imiquimod.  The purpose is to decrease the number of clinically evident and subclinical PreCancerous lesions to prevent progression to development of skin cancer by chemically destroying early precancer changes that may or may not be visible.  It has been shown to reduce the risk of developing skin cancer in the treated area. As a result of treatment, redness, scaling, crusting, and open sores may occur during treatment course. One or more than one of these methods may be used and may have to be used several times to control, suppress and eliminate the PreCancerous changes. Discussed treatment course, expected reaction, and possible side effects. - Recommend daily broad spectrum sunscreen SPF 30+ to sun-exposed areas, reapply every 2 hours as needed.  - Staying in the shade or wearing long sleeves, sun glasses (UVA+UVB protection) and wide brim hats (4-inch brim around the entire circumference of the hat) are also recommended. - Call for new or changing lesions. - Recommend red light PDT with debridement to face.  Patient will schedule for this fall.    LENTIGINES, SEBORRHEIC KERATOSES, HEMANGIOMAS - Benign normal skin lesions - Benign-appearing - Call for any changes  MELANOCYTIC NEVI - Tan-brown and/or pink-flesh-colored symmetric macules and papules - Benign appearing on exam today - Observation - Call clinic for new or changing moles - Recommend daily use of broad spectrum spf 30+ sunscreen to sun-exposed areas.    HISTORY OF BASAL CELL CARCINOMA OF THE SKIN - No evidence of  recurrence today at right nasal tip - Recommend regular full body  skin exams - Recommend daily broad spectrum sunscreen SPF 30+ to sun-exposed areas, reapply every 2 hours as needed.  - Call if any new or changing lesions are noted between office visits  Acrochordons (Skin Tags) - Fleshy, skin-colored pedunculated papule at left lower axilla - Benign appearing.  - Observe. - If desired, they can be removed with an in office procedure that is not covered by insurance. - Please call the clinic if you notice any new or changing lesions.  INFLAMMATORY PAPULE/CYST VS OTHER Exam: Left chin with inflammatory papule. Benign features under dermoscopy.   Sample of Neutrogena BP spot treatment given. Benzoyl peroxide can cause dryness and irritation of the skin. It can also bleach fabric. When used together with Aczone (dapsone) cream, it can stain the skin orange.  Observation.  AK (ACTINIC KERATOSIS) Left Eyebrow x 2 Actinic keratoses are precancerous spots that appear secondary to cumulative UV radiation exposure/sun exposure over time. They are chronic with expected duration over 1 year. A portion of actinic keratoses will progress to squamous cell carcinoma of the skin. It is not possible to reliably predict which spots will progress to skin cancer and so treatment is recommended to prevent development of skin cancer.  Recommend daily broad spectrum sunscreen SPF 30+ to sun-exposed areas, reapply every 2 hours as needed.  Recommend staying in the shade or wearing long sleeves, sun glasses (UVA+UVB protection) and wide brim hats (4-inch brim around the entire circumference of the hat). Call for new or changing lesions. Destruction of lesion - Left Eyebrow x 2  Destruction method: cryotherapy   Informed consent: discussed and consent obtained   Lesion destroyed using liquid nitrogen: Yes   Region frozen until ice ball extended beyond lesion: Yes   Outcome: patient tolerated procedure well with no complications   Post-procedure details: wound care  instructions given   Additional details:  Prior to procedure, discussed risks of blister formation, small wound, skin dyspigmentation, or rare scar following cryotherapy. Recommend Vaseline ointment to treated areas while healing.   INFLAMED SEBORRHEIC KERATOSIS R lat thigh Symptomatic, irritating, patient would like treated. Destruction of lesion - R lat thigh  Destruction method: cryotherapy   Informed consent: discussed and consent obtained   Lesion destroyed using liquid nitrogen: Yes   Region frozen until ice ball extended beyond lesion: Yes   Outcome: patient tolerated procedure well with no complications   Post-procedure details: wound care instructions given   Additional details:  Prior to procedure, discussed risks of blister formation, small wound, skin dyspigmentation, or rare scar following cryotherapy. Recommend Vaseline ointment to treated areas while healing.   SEBORRHEIC KERATOSIS, INFLAMED Left Inguinal Area Vs Wart   Destruction of lesion - Left Inguinal Area  Destruction method: cryotherapy   Informed consent: discussed and consent obtained   Lesion destroyed using liquid nitrogen: Yes   Region frozen until ice ball extended beyond lesion: Yes   Outcome: patient tolerated procedure well with no complications   Post-procedure details: wound care instructions given   Additional details:  Prior to procedure, discussed risks of blister formation, small wound, skin dyspigmentation, or rare scar following cryotherapy. Recommend Vaseline ointment to treated areas while healing.   SKIN CANCER SCREENING   ACTINIC SKIN DAMAGE   LENTIGO   SEBORRHEIC KERATOSIS   HEMANGIOMA OF SKIN   NEVUS   Return in about 1 year (around 04/02/2025) for TBSE, Hx BCC, Hx AKs. Also red light  with debridement to face in the fall.  IAndrea Kerns, CMA, am acting as scribe for Rexene Rattler, MD   Documentation: I have reviewed the above documentation for accuracy and  completeness, and I agree with the above.  Rexene Rattler, MD

## 2024-04-02 NOTE — Patient Instructions (Addendum)

## 2024-07-02 ENCOUNTER — Ambulatory Visit

## 2024-07-02 ENCOUNTER — Encounter

## 2024-07-23 ENCOUNTER — Ambulatory Visit (INDEPENDENT_AMBULATORY_CARE_PROVIDER_SITE_OTHER): Admitting: Dermatology

## 2024-07-23 ENCOUNTER — Encounter

## 2024-07-23 DIAGNOSIS — L280 Lichen simplex chronicus: Secondary | ICD-10-CM

## 2024-07-23 DIAGNOSIS — Z79899 Other long term (current) drug therapy: Secondary | ICD-10-CM

## 2024-07-23 DIAGNOSIS — L708 Other acne: Secondary | ICD-10-CM | POA: Diagnosis not present

## 2024-07-23 DIAGNOSIS — L570 Actinic keratosis: Secondary | ICD-10-CM

## 2024-07-23 DIAGNOSIS — L578 Other skin changes due to chronic exposure to nonionizing radiation: Secondary | ICD-10-CM

## 2024-07-23 DIAGNOSIS — Z7189 Other specified counseling: Secondary | ICD-10-CM

## 2024-07-23 DIAGNOSIS — L700 Acne vulgaris: Secondary | ICD-10-CM

## 2024-07-23 MED ORDER — AMINOLEVULINIC ACID HCL 10 % EX GEL
2000.0000 mg | Freq: Once | CUTANEOUS | Status: AC
  Administered 2024-07-23: 2000 mg via TOPICAL

## 2024-07-23 NOTE — Patient Instructions (Signed)
 Start Zoryve Cream after healed from PDT. Spot treat to chin once daily.   Ameluz/Red Light Treatment Common Side Effects  - Burning/stinging, which may be severe and last up to 24-72 hours after your treatment  - Redness, swelling and/or peeling which may last up to 4 weeks  - Scaling/crusting which may last up to 2 weeks  - Sun sensitivity (you MUST avoid sun exposure for 48-72 hours after treatment)  Care Instructions  - Okay to wash with soap and water and shampoo as normal  - If needed, you can do a cold compress (ex. Ice packs) for comfort  - If okay with your Primary Doctor, you may use analgesics such as Tylenol  every 4-6 hours, not to exceed recommended dose  - You may apply Cerave Healing Ointment, Vaseline or Aquaphor  - If you have a lot of swelling you may take a Benadryl to help with this (this may cause drowsiness)  Sun Precautions  - Wear a wide brim hat for the next week if outside  - Wear a sunblock with zinc or titanium dioxide at least SPF 50 daily   We will recheck you in 10-12 weeks. If any problems, please call the office and ask to speak with a nurse.

## 2024-07-23 NOTE — Progress Notes (Unsigned)
   Follow-Up Visit   Subjective  Valerie Mcguire is a 68 y.o. female who presents for the following: Patient here for red light on his face for precancers.  Check acne on her face   The following portions of the chart were reviewed this encounter and updated as appropriate: medications, allergies, medical history  Review of Systems:  No other skin or systemic complaints except as noted in HPI or Assessment and Plan.  Objective  Well appearing patient in no apparent distress; mood and affect are within normal limits.  A focused examination was performed of the following areas:face,  Relevant exam findings are noted in the Assessment and Plan.    Assessment & Plan   ACNE GRANULOMA with Lichen simplex chronicus  Pt admits to picking face Exam: crusted papule L chin Chronic and persistent condition with duration or expected duration over one year. Condition is symptomatic/ bothersome to patient. Not currently at goal.  Treatment Plan: Start samples of Zoryve 0.3 spot treat daily Lot  [facility] Exp 07/12/2026   Follow up with Dr. Jackquline in February.     AK (ACTINIC KERATOSIS)   Related Medications Aminolevulinic Acid HCl 10 % GEL 2,000 mg  Patient completed red light phototherapy with debridement today.  ACTINIC KERATOSES Exam: Erythematous thin papules/macules with gritty scale.  Treatment Plan:  Red Light Photodynamic therapy  Procedure discussed: discussed risks, benefits, side effects. and alternatives   Prep: site scrubbed/prepped with acetone   Debridement needed: Yes (performed by Physician with sand paper.  (CPT [id number])) Location:  face Number of lesions:  Multiple (> 15) Type of treatment:  Red light Aminolevulinic Acid (see MAR for details): Ameluz Aminolevulinic Acid comment:  [id number] Amount of Ameluz (mg):  1 Incubation time (minutes):  60 Number of minutes under lamp:  20 Cooling:  Fan Outcome: patient tolerated procedure well with no complications    Post-procedure details: sunscreen applied and aftercare instructions given to patient    Related Medications Aminolevulinic Acid HCl 10 % GEL 2,000 mg  Amanda White, RMA  I personally debrided area prior to application of aminolevulinic acid   Documentation: I have reviewed the above documentation for accuracy and completeness, and I agree with the above.  Alm Rhyme, MD

## 2024-07-24 ENCOUNTER — Encounter: Payer: Self-pay | Admitting: Dermatology

## 2024-10-22 ENCOUNTER — Ambulatory Visit: Admitting: Dermatology

## 2025-04-15 ENCOUNTER — Encounter: Admitting: Dermatology
